# Patient Record
Sex: Female | Born: 1955 | Race: White | Hispanic: No | Marital: Married | Smoking: Former smoker
Health system: Southern US, Community
[De-identification: ages and names within clinical notes are randomized; demographics above are authoritative.]

## PROBLEM LIST (undated history)

## (undated) DIAGNOSIS — G20 Parkinson's disease: Secondary | ICD-10-CM

## (undated) DIAGNOSIS — F419 Anxiety disorder, unspecified: Secondary | ICD-10-CM

## (undated) DIAGNOSIS — E119 Type 2 diabetes mellitus without complications: Secondary | ICD-10-CM

## (undated) DIAGNOSIS — E079 Disorder of thyroid, unspecified: Secondary | ICD-10-CM

## (undated) DIAGNOSIS — G20A1 Parkinson's disease without dyskinesia, without mention of fluctuations: Secondary | ICD-10-CM

## (undated) DIAGNOSIS — E7800 Pure hypercholesterolemia, unspecified: Secondary | ICD-10-CM

## (undated) DIAGNOSIS — E1142 Type 2 diabetes mellitus with diabetic polyneuropathy: Secondary | ICD-10-CM

## (undated) DIAGNOSIS — F32A Depression, unspecified: Secondary | ICD-10-CM

## (undated) DIAGNOSIS — C801 Malignant (primary) neoplasm, unspecified: Secondary | ICD-10-CM

## (undated) DIAGNOSIS — F329 Major depressive disorder, single episode, unspecified: Secondary | ICD-10-CM

## (undated) HISTORY — PX: ROTATOR CUFF REPAIR: SHX139

## (undated) HISTORY — DX: Pure hypercholesterolemia, unspecified: E78.00

## (undated) HISTORY — DX: Parkinson's disease without dyskinesia, without mention of fluctuations: G20.A1

## (undated) HISTORY — PX: LUNG CANCER SURGERY: SHX702

## (undated) HISTORY — DX: Type 2 diabetes mellitus with diabetic polyneuropathy: E11.42

## (undated) HISTORY — DX: Parkinson's disease: G20

## (undated) HISTORY — PX: FINGER NAIL SURGERY: SHX717

## (undated) HISTORY — DX: Malignant (primary) neoplasm, unspecified: C80.1

---

## 1998-12-07 ENCOUNTER — Other Ambulatory Visit: Admission: RE | Admit: 1998-12-07 | Discharge: 1998-12-07 | Payer: Self-pay | Admitting: Obstetrics and Gynecology

## 1999-04-22 ENCOUNTER — Other Ambulatory Visit: Admission: RE | Admit: 1999-04-22 | Discharge: 1999-04-22 | Payer: Self-pay | Admitting: Obstetrics and Gynecology

## 1999-11-06 ENCOUNTER — Ambulatory Visit (HOSPITAL_COMMUNITY): Admission: RE | Admit: 1999-11-06 | Discharge: 1999-11-06 | Payer: Self-pay | Admitting: Gastroenterology

## 2000-04-17 ENCOUNTER — Other Ambulatory Visit: Admission: RE | Admit: 2000-04-17 | Discharge: 2000-04-17 | Payer: Self-pay | Admitting: Obstetrics and Gynecology

## 2001-05-12 ENCOUNTER — Other Ambulatory Visit: Admission: RE | Admit: 2001-05-12 | Discharge: 2001-05-12 | Payer: Self-pay | Admitting: Obstetrics and Gynecology

## 2001-11-26 ENCOUNTER — Ambulatory Visit (HOSPITAL_COMMUNITY): Admission: RE | Admit: 2001-11-26 | Discharge: 2001-11-26 | Payer: Self-pay | Admitting: Gastroenterology

## 2001-12-16 ENCOUNTER — Encounter: Payer: Self-pay | Admitting: Urology

## 2001-12-16 ENCOUNTER — Ambulatory Visit (HOSPITAL_BASED_OUTPATIENT_CLINIC_OR_DEPARTMENT_OTHER): Admission: RE | Admit: 2001-12-16 | Discharge: 2001-12-16 | Payer: Self-pay | Admitting: Urology

## 2002-08-15 ENCOUNTER — Other Ambulatory Visit: Admission: RE | Admit: 2002-08-15 | Discharge: 2002-08-15 | Payer: Self-pay | Admitting: Obstetrics and Gynecology

## 2002-09-20 ENCOUNTER — Encounter: Admission: RE | Admit: 2002-09-20 | Discharge: 2002-09-20 | Payer: Self-pay | Admitting: Urology

## 2002-09-20 ENCOUNTER — Encounter: Payer: Self-pay | Admitting: Urology

## 2002-09-22 ENCOUNTER — Encounter: Payer: Self-pay | Admitting: Emergency Medicine

## 2002-09-22 ENCOUNTER — Emergency Department (HOSPITAL_COMMUNITY): Admission: EM | Admit: 2002-09-22 | Discharge: 2002-09-22 | Payer: Self-pay | Admitting: Emergency Medicine

## 2003-09-01 ENCOUNTER — Other Ambulatory Visit: Admission: RE | Admit: 2003-09-01 | Discharge: 2003-09-01 | Payer: Self-pay | Admitting: Obstetrics and Gynecology

## 2003-09-06 ENCOUNTER — Encounter: Admission: RE | Admit: 2003-09-06 | Discharge: 2003-09-06 | Payer: Self-pay | Admitting: Gastroenterology

## 2004-05-30 ENCOUNTER — Ambulatory Visit (HOSPITAL_COMMUNITY): Admission: RE | Admit: 2004-05-30 | Discharge: 2004-05-30 | Payer: Self-pay | Admitting: Internal Medicine

## 2005-04-04 ENCOUNTER — Emergency Department (HOSPITAL_COMMUNITY): Admission: EM | Admit: 2005-04-04 | Discharge: 2005-04-04 | Payer: Self-pay | Admitting: Emergency Medicine

## 2009-02-16 ENCOUNTER — Encounter: Payer: Self-pay | Admitting: Pulmonary Disease

## 2009-03-09 ENCOUNTER — Ambulatory Visit: Payer: Self-pay | Admitting: Pulmonary Disease

## 2009-03-09 DIAGNOSIS — J42 Unspecified chronic bronchitis: Secondary | ICD-10-CM | POA: Insufficient documentation

## 2009-03-09 DIAGNOSIS — J438 Other emphysema: Secondary | ICD-10-CM | POA: Insufficient documentation

## 2009-03-09 DIAGNOSIS — R05 Cough: Secondary | ICD-10-CM

## 2009-03-09 DIAGNOSIS — E7800 Pure hypercholesterolemia, unspecified: Secondary | ICD-10-CM

## 2009-03-09 DIAGNOSIS — G4730 Sleep apnea, unspecified: Secondary | ICD-10-CM | POA: Insufficient documentation

## 2011-02-14 NOTE — Procedures (Signed)
[place]. [facility]  Patient:    NAVY, ROTHSCHILD Visit Number: [id number] MRN: [id number]          Service Type: END Location: ENDO Attending Physician:  Charna Elizabeth Dictated by:   Anselmo Rod, M.D. Proc. Date: 11/26/01 Admit Date:  11/26/2001   CC:         Laqueta Linden, M.D.   Procedure Report  DATE OF BIRTH:  04/18/1956.  PROCEDURE:  Colonoscopy.  ENDOSCOPIST:  Anselmo Rod, M.D.  INSTRUMENT USED:  Olympus video colonoscope.  INDICATION FOR PROCEDURE:  A 55 year old white female with a history of left lower quadrant pain and rectal bleeding.  The patient was also found to be guaiac-positive on physical exam in my office.  Rule out colonic polyps, masses, hemorrhoids, etc.  PREPROCEDURE PREPARATION:  Informed consent was procured from the patient. The patient was fasted for eight hours prior to the procedure and prepped with a bottle of magnesium citrate and two bottles of Fleets Phospho-Soda with a gallon of NuLytely the night prior to the procedure.  PREPROCEDURE PHYSICAL:  VITAL SIGNS:  The patient had stable vital signs.  NECK:  Supple.  CHEST:  Clear to auscultation.  S1, S2 regular.  ABDOMEN:  Soft with normal bowel sounds.  DESCRIPTION OF PROCEDURE:  The patient was placed in the left lateral decubitus position and sedated with 80 mg of Demerol and 8 mg of Versed intravenously.  Once the patient was adequately sedate and maintained on low-flow oxygen and continuous cardiac monitoring, the Olympus video colonoscope was advanced from the rectum to the cecum and terminal ileum without difficulty.  The entire colonic mucosa appeared healthy with a normal vascular pattern.  No masses, polyps, erosions, ulcerations were seen.  There was no evidence of diverticulosis.  The patient had small, nonbleeding internal hemorrhoids seen on retroflexion and tolerated the procedure well without complications.  There was some  residual stool in the colon, but multiple washes were done and visualization was adequate.  IMPRESSION:  Normal colonoscopy up to the terminal ileum except for small internal hemorrhoid.  RECOMMENDATIONS: 1. Zelnorm 6 mg b.i.d. has been advised to the patient with regard to her    ongoing constipation. 2. A high-fiber diet with liberal fluid intake has been advocated. 3. Outpatient follow-up is advised in the next three to four weeks. Dictated by:   Anselmo Rod, M.D. Attending Physician:  Charna Elizabeth DD:  11/26/01 TD:  11/26/01 Job: [id number]

## 2012-08-09 ENCOUNTER — Emergency Department (INDEPENDENT_AMBULATORY_CARE_PROVIDER_SITE_OTHER): Payer: 59

## 2012-08-09 ENCOUNTER — Encounter: Payer: Self-pay | Admitting: *Deleted

## 2012-08-09 ENCOUNTER — Emergency Department (INDEPENDENT_AMBULATORY_CARE_PROVIDER_SITE_OTHER)
Admission: EM | Admit: 2012-08-09 | Discharge: 2012-08-09 | Disposition: A | Discharge status: Home / Self Care | Payer: 59 | Source: Home / Self Care

## 2012-08-09 DIAGNOSIS — J449 Chronic obstructive pulmonary disease, unspecified: Secondary | ICD-10-CM

## 2012-08-09 DIAGNOSIS — R918 Other nonspecific abnormal finding of lung field: Secondary | ICD-10-CM

## 2012-08-09 DIAGNOSIS — J069 Acute upper respiratory infection, unspecified: Secondary | ICD-10-CM

## 2012-08-09 DIAGNOSIS — R05 Cough: Secondary | ICD-10-CM

## 2012-08-09 HISTORY — DX: Depression, unspecified: F32.A

## 2012-08-09 HISTORY — DX: Anxiety disorder, unspecified: F41.9

## 2012-08-09 HISTORY — DX: Disorder of thyroid, unspecified: E07.9

## 2012-08-09 HISTORY — DX: Major depressive disorder, single episode, unspecified: F32.9

## 2012-08-09 HISTORY — DX: Type 2 diabetes mellitus without complications: E11.9

## 2012-08-09 LAB — POCT INFLUENZA A/B: Influenza B, POC: NEGATIVE

## 2012-08-09 MED ORDER — PREDNISONE 50 MG PO TABS: ORAL_TABLET | ORAL | Status: DC

## 2012-08-09 MED ORDER — BENZONATATE 100 MG PO CAPS: 100.0000 mg | ORAL_CAPSULE | Freq: Three times a day (TID) | ORAL | Status: DC | PRN

## 2012-08-09 MED ORDER — DOXYCYCLINE HYCLATE 100 MG PO CAPS: 100.0000 mg | ORAL_CAPSULE | Freq: Two times a day (BID) | ORAL | Status: AC

## 2012-08-09 NOTE — ED Provider Notes (Signed)
History     CSN: [id number]  Arrival date & time 08/09/12  1148   First MD Initiated Contact with Patient 08/09/12 1150      Chief Complaint  Patient presents with  . Cough  . Otalgia    right    HPI  URI Symptoms Onset: 1 week  Description: post nasal drip, cough, ear pain  Modifying factors:  Prior hx/o PNA and lung ca. Also with informal dx of COPD in setting of former 40 pack year smoking history. Minimal lung complaints currently  Symptoms Nasal discharge: mild Fever: mild Sore throat: yes Cough: yes Wheezing: no Ear pain: no GI symptoms: no Sick contacts: yes  Red Flags  Stiff neck: no Dyspnea: no Rash: no Swallowing difficulty: no  Sinusitis Risk Factors Headache/face pain: no Double sickening: no tooth pain: no  Allergy Risk Factors Sneezing: no Itchy scratchy throat: no Seasonal symptoms: no  Flu Risk Factors Headache: no muscle aches: no severe fatigue: no   Past Medical History  Diagnosis Date  . Diabetes mellitus without complication   . Depression   . Anxiety   . Thyroid disease     Past Surgical History  Procedure Date  . Lung tumor removed     Family History  Problem Relation Age of Onset  . Heart failure Mother   . Non-Hodgkin's lymphoma Father   . Diabetes Sister   . Heart disease Sister     History  Substance Use Topics  . Smoking status: Former Games developer  . Smokeless tobacco: Not on file  . Alcohol Use: No    OB History    Grav Para Term Preterm Abortions TAB SAB Ect Mult Living                  Review of Systems  All other systems reviewed and are negative.    Allergies  Codeine and Hydromorphone hcl  Home Medications   Current Outpatient Rx  Name  Route  Sig  Dispense  Refill  . BUPROPION HCL ER (XL) 300 MG PO TB24   Oral   Take 300 mg by mouth daily.         Marland Kitchen CITALOPRAM HYDROBROMIDE 20 MG PO TABS   Oral   Take 20 mg by mouth daily.         Marland Kitchen CLONAZEPAM 2 MG PO TABS   Oral   Take 2 mg  by mouth 2 (two) times daily as needed.         Marland Kitchen LEVOTHYROXINE SODIUM 100 MCG PO TABS   Oral   Take 100 mcg by mouth daily.         Marland Kitchen METFORMIN HCL 500 MG PO TABS   Oral   Take 500 mg by mouth 2 (two) times daily with a meal.         . SIMVASTATIN 40 MG PO TABS   Oral   Take 40 mg by mouth every evening.           BP 138/82  Pulse 80  Temp 97.9 F (36.6 C) (Oral)  Resp 14  Ht 5\' 4"  (1.626 m)  Wt 197 lb (89.359 kg)  BMI 33.82 kg/m2  SpO2 97%  Physical Exam  Constitutional: She appears well-developed and well-nourished.  HENT:  Head: Normocephalic and atraumatic.  Right Ear: External ear normal.  Left Ear: External ear normal.       +nasal erythema, rhinorrhea bilaterally, + post oropharyngeal erythema    Eyes: Conjunctivae normal are  normal. Pupils are equal, round, and reactive to light.  Neck: Normal range of motion. Neck supple.  Cardiovascular: Normal rate, regular rhythm and normal heart sounds.   Pulmonary/Chest: Effort normal.       Faint rales in upper lobes  Faint wheezes    Abdominal: Soft.  Musculoskeletal: Normal range of motion.  Lymphadenopathy:    She has no cervical adenopathy.  Neurological: She is alert.  Skin: Skin is warm.    ED Course  Procedures (including critical care time)  Labs Reviewed - No data to display Dg Chest 2 View  08/09/2012  *RADIOLOGY REPORT*  Clinical Data: Cough for 1 week, history of pneumonia lung cancer  CHEST - 2 VIEW  Comparison: None  Findings: Surgical clips at mediastinum. Normal heart size, mediastinal contours and pulmonary vascularity. Atherosclerotic calcification aortic arch. Minimal atelectasis or scarring at left base. Lungs otherwise clear. No pleural effusion or pneumothorax. Bones unremarkable. Lateral view image quality degraded by quantum mottling artifact.  IMPRESSION: Minimal atelectasis versus scarring left base.   Original Report Authenticated By: Ulyses Southward, M.D.      1. URI (upper  respiratory infection)   2. COPD (chronic obstructive pulmonary disease)       MDM  Suspect URI induced mild COPD exacerbation.  Will treat with prednisone and doxy.  Discussed supportive care and infectious red flags. Lower threshold for reevaluation given medical history.  Plan for follow up in 2-3 days for reevaluation either here or with PCP.      The patient and/or caregiver has been counseled thoroughly with regard to treatment plan and/or medications prescribed including dosage, schedule, interactions, rationale for use, and possible side effects and they verbalize understanding. Diagnoses and expected course of recovery discussed and will return if not improved as expected or if the condition worsens. Patient and/or caregiver verbalized understanding.             Doree Albee, MD 08/09/12 [phone]

## 2012-08-09 NOTE — ED Notes (Signed)
Patient c/o dry cough right ear pain and sore throat x yesterday. Denies fever. No otc meds taken

## 2013-07-25 ENCOUNTER — Encounter: Payer: Self-pay | Admitting: Emergency Medicine

## 2013-07-25 ENCOUNTER — Emergency Department (INDEPENDENT_AMBULATORY_CARE_PROVIDER_SITE_OTHER): Payer: 59

## 2013-07-25 ENCOUNTER — Emergency Department (INDEPENDENT_AMBULATORY_CARE_PROVIDER_SITE_OTHER)
Admission: EM | Admit: 2013-07-25 | Discharge: 2013-07-25 | Disposition: A | Discharge status: Home / Self Care | Payer: 59 | Source: Home / Self Care | Attending: Emergency Medicine | Admitting: Emergency Medicine

## 2013-07-25 DIAGNOSIS — S139XXA Sprain of joints and ligaments of unspecified parts of neck, initial encounter: Secondary | ICD-10-CM

## 2013-07-25 DIAGNOSIS — M47812 Spondylosis without myelopathy or radiculopathy, cervical region: Secondary | ICD-10-CM

## 2013-07-25 DIAGNOSIS — S161XXA Strain of muscle, fascia and tendon at neck level, initial encounter: Secondary | ICD-10-CM

## 2013-07-25 MED ORDER — CYCLOBENZAPRINE HCL 10 MG PO TABS: ORAL_TABLET | ORAL | Status: DC

## 2013-07-25 MED ORDER — IBUPROFEN 600 MG PO TABS: 600.0000 mg | ORAL_TABLET | Freq: Four times a day (QID) | ORAL | Status: DC | PRN
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
## 2014-12-30 DIAGNOSIS — Z85118 Personal history of other malignant neoplasm of bronchus and lung: Secondary | ICD-10-CM | POA: Diagnosis not present

## 2014-12-30 DIAGNOSIS — G20 Parkinson's disease: Secondary | ICD-10-CM | POA: Diagnosis not present

## 2014-12-30 DIAGNOSIS — K219 Gastro-esophageal reflux disease without esophagitis: Secondary | ICD-10-CM | POA: Diagnosis not present

## 2014-12-30 DIAGNOSIS — Z7982 Long term (current) use of aspirin: Secondary | ICD-10-CM | POA: Diagnosis not present

## 2014-12-30 DIAGNOSIS — F329 Major depressive disorder, single episode, unspecified: Secondary | ICD-10-CM | POA: Diagnosis not present

## 2014-12-30 DIAGNOSIS — F419 Anxiety disorder, unspecified: Secondary | ICD-10-CM | POA: Diagnosis not present

## 2014-12-30 DIAGNOSIS — Z87891 Personal history of nicotine dependence: Secondary | ICD-10-CM | POA: Diagnosis not present

## 2014-12-30 DIAGNOSIS — Z79899 Other long term (current) drug therapy: Secondary | ICD-10-CM | POA: Diagnosis not present

## 2014-12-30 DIAGNOSIS — E780 Pure hypercholesterolemia: Secondary | ICD-10-CM | POA: Diagnosis not present

## 2014-12-30 DIAGNOSIS — E119 Type 2 diabetes mellitus without complications: Secondary | ICD-10-CM | POA: Diagnosis not present

## 2014-12-30 DIAGNOSIS — R079 Chest pain, unspecified: Secondary | ICD-10-CM | POA: Diagnosis present

## 2014-12-30 DIAGNOSIS — E785 Hyperlipidemia, unspecified: Secondary | ICD-10-CM | POA: Diagnosis not present

## 2014-12-30 DIAGNOSIS — E039 Hypothyroidism, unspecified: Secondary | ICD-10-CM | POA: Diagnosis not present

## 2014-12-30 DIAGNOSIS — Z886 Allergy status to analgesic agent status: Secondary | ICD-10-CM | POA: Diagnosis not present

## 2014-12-30 DIAGNOSIS — I10 Essential (primary) hypertension: Secondary | ICD-10-CM | POA: Diagnosis not present

## 2015-01-01 DIAGNOSIS — R079 Chest pain, unspecified: Secondary | ICD-10-CM

## 2015-01-02 ENCOUNTER — Encounter (HOSPITAL_COMMUNITY)
Admission: RE | Disposition: A | Discharge status: Home / Self Care | Payer: Self-pay | Source: Ambulatory Visit | Attending: Cardiology

## 2015-01-02 ENCOUNTER — Ambulatory Visit (HOSPITAL_COMMUNITY)
Admission: RE | Admit: 2015-01-02 | Discharge: 2015-01-02 | Disposition: A | Discharge status: Home / Self Care | Payer: 59 | Source: Ambulatory Visit | Attending: Cardiology | Admitting: Cardiology

## 2015-01-02 ENCOUNTER — Encounter (HOSPITAL_COMMUNITY): Payer: Self-pay | Admitting: Cardiology

## 2015-01-02 DIAGNOSIS — E039 Hypothyroidism, unspecified: Secondary | ICD-10-CM | POA: Insufficient documentation

## 2015-01-02 DIAGNOSIS — K219 Gastro-esophageal reflux disease without esophagitis: Secondary | ICD-10-CM | POA: Insufficient documentation

## 2015-01-02 DIAGNOSIS — E780 Pure hypercholesterolemia: Secondary | ICD-10-CM | POA: Insufficient documentation

## 2015-01-02 DIAGNOSIS — I10 Essential (primary) hypertension: Secondary | ICD-10-CM | POA: Insufficient documentation

## 2015-01-02 DIAGNOSIS — Z85118 Personal history of other malignant neoplasm of bronchus and lung: Secondary | ICD-10-CM | POA: Insufficient documentation

## 2015-01-02 DIAGNOSIS — Z7982 Long term (current) use of aspirin: Secondary | ICD-10-CM | POA: Insufficient documentation

## 2015-01-02 DIAGNOSIS — R079 Chest pain, unspecified: Secondary | ICD-10-CM | POA: Diagnosis not present

## 2015-01-02 DIAGNOSIS — G20 Parkinson's disease: Secondary | ICD-10-CM | POA: Insufficient documentation

## 2015-01-02 DIAGNOSIS — Z886 Allergy status to analgesic agent status: Secondary | ICD-10-CM | POA: Insufficient documentation

## 2015-01-02 DIAGNOSIS — F419 Anxiety disorder, unspecified: Secondary | ICD-10-CM | POA: Insufficient documentation

## 2015-01-02 DIAGNOSIS — Z79899 Other long term (current) drug therapy: Secondary | ICD-10-CM | POA: Insufficient documentation

## 2015-01-02 DIAGNOSIS — F329 Major depressive disorder, single episode, unspecified: Secondary | ICD-10-CM | POA: Insufficient documentation

## 2015-01-02 DIAGNOSIS — E785 Hyperlipidemia, unspecified: Secondary | ICD-10-CM | POA: Insufficient documentation

## 2015-01-02 DIAGNOSIS — E119 Type 2 diabetes mellitus without complications: Secondary | ICD-10-CM | POA: Insufficient documentation

## 2015-01-02 DIAGNOSIS — Z87891 Personal history of nicotine dependence: Secondary | ICD-10-CM | POA: Insufficient documentation

## 2015-01-02 HISTORY — PX: LEFT HEART CATHETERIZATION WITH CORONARY ANGIOGRAM: SHX5451

## 2015-01-02 LAB — GLUCOSE, CAPILLARY: GLUCOSE-CAPILLARY: 170 mg/dL — AB (ref 70–99)

## 2015-01-02 SURGERY — LEFT HEART CATHETERIZATION WITH CORONARY ANGIOGRAM
Anesthesia: LOCAL

## 2015-01-02 MED ORDER — FENTANYL CITRATE 0.05 MG/ML IJ SOLN
INTRAMUSCULAR | Status: AC
  Filled 2015-01-02: qty 2

## 2015-01-02 MED ORDER — DIPHENHYDRAMINE HCL 50 MG/ML IJ SOLN
INTRAMUSCULAR | Status: AC
  Filled 2015-01-02: qty 1

## 2015-01-02 MED ORDER — SODIUM CHLORIDE 0.9 % IV SOLN: 250.0000 mL | INTRAVENOUS | Status: DC | PRN

## 2015-01-02 MED ORDER — SODIUM CHLORIDE 0.9 % IV SOLN: 1.0000 mL/kg/h | INTRAVENOUS | Status: DC

## 2015-01-02 MED ORDER — ASPIRIN 81 MG PO CHEW
CHEWABLE_TABLET | ORAL | Status: AC
  Administered 2015-01-02: 81 mg
  Filled 2015-01-02: qty 1

## 2015-01-02 MED ORDER — NITROGLYCERIN 1 MG/10 ML FOR IR/CATH LAB
INTRA_ARTERIAL | Status: AC
  Filled 2015-01-02: qty 10

## 2015-01-02 MED ORDER — HEPARIN (PORCINE) IN NACL 2-0.9 UNIT/ML-% IJ SOLN
INTRAMUSCULAR | Status: AC
  Filled 2015-01-02: qty 1000

## 2015-01-02 MED ORDER — SODIUM CHLORIDE 0.9 % IJ SOLN
3.0000 mL | INTRAMUSCULAR | Status: DC | PRN
  Administered 2015-01-02: 3 mL via INTRAVENOUS
  Filled 2015-01-02: qty 3

## 2015-01-02 MED ORDER — LIDOCAINE HCL (PF) 1 % IJ SOLN
INTRAMUSCULAR | Status: AC
  Filled 2015-01-02: qty 30

## 2015-01-02 MED ORDER — ASPIRIN 81 MG PO CHEW: 81.0000 mg | CHEWABLE_TABLET | ORAL | Status: DC

## 2015-01-02 MED ORDER — EMPAGLIFLOZIN-METFORMIN HCL 5-1000 MG PO TABS: 1.0000 | ORAL_TABLET | Freq: Every day | ORAL | Status: DC

## 2015-01-02 MED ORDER — SODIUM CHLORIDE 0.9 % IJ SOLN: 3.0000 mL | Freq: Two times a day (BID) | INTRAMUSCULAR | Status: DC

## 2015-01-02 MED ORDER — VERAPAMIL HCL 2.5 MG/ML IV SOLN
INTRAVENOUS | Status: AC
  Filled 2015-01-02: qty 2

## 2015-01-02 MED ORDER — MIDAZOLAM HCL 2 MG/2ML IJ SOLN
INTRAMUSCULAR | Status: AC
  Filled 2015-01-02: qty 2

## 2015-01-02 MED ORDER — SODIUM CHLORIDE 0.9 % IV SOLN
Freq: Once | INTRAVENOUS | Status: AC
  Administered 2015-01-02: 09:00:00 via INTRAVENOUS

## 2015-01-02 MED ORDER — HEPARIN SODIUM (PORCINE) 1000 UNIT/ML IJ SOLN
INTRAMUSCULAR | Status: AC
  Filled 2015-01-02: qty 1

## 2015-01-02 MED ORDER — SODIUM CHLORIDE 0.9 % IV SOLN: INTRAVENOUS | Status: DC

## 2015-01-02 NOTE — Interval H&P Note (Signed)
History and Physical Interval Note:  01/02/2015 11:06 AM  Kelli Rojas  has presented today for surgery, with the diagnosis of cp, abnormal stress test  The various methods of treatment have been discussed with the patient and family. After consideration of risks, benefits and other options for treatment, the patient has consented to  Procedure(s): LEFT HEART CATHETERIZATION WITH CORONARY ANGIOGRAM (N/A) and possible PCI  as a surgical intervention .  The patient's history has been reviewed, patient examined, no change in status, stable for surgery.  I have reviewed the patient's chart and labs.  Questions were answered to the patient's satisfaction.   Ischemic Symptoms? CCS II (Slight limitation of ordinary activity) Anti-ischemic Medical Therapy? Minimal Therapy (1 class of medications) Non-invasive Test Results? Intermediate-risk stress test findings: cardiac mortality 1-3%/year Prior CABG? No Previous CABG   Patient Information:   1-2V CAD, no prox LAD  U (5)  Indication: 16; Score: 5   Patient Information:   CTO of 1 vessel, no other CAD  U (4)  Indication: 26; Score: 4   Patient Information:   1V CAD with prox LAD  U (6)  Indication: 32; Score: 6   Patient Information:   2V-CAD with prox LAD  A (7)  Indication: 38; Score: 7   Patient Information:   3V-CAD without LMCA  A (7)  Indication: 44; Score: 7   Patient Information:   3V-CAD without LMCA With Abnormal LV systolic function  A (9)  Indication: 48; Score: 9   Patient Information:   LMCA-CAD  A (9)  Indication: 49; Score: 9   Patient Information:   2V-CAD with prox LAD PCI  A (7)  Indication: 62; Score: 7   Patient Information:   2V-CAD with prox LAD CABG  A (8)  Indication: 62; Score: 8   Patient Information:   3V-CAD without LMCA With Low CAD burden(i.e., 3 focal stenoses, low SYNTAX score) PCI  A (7)  Indication: 63; Score: 7   Patient Information:   3V-CAD without  LMCA With Low CAD burden(i.e., 3 focal stenoses, low SYNTAX score) CABG  A (9)  Indication: 63; Score: 9   Patient Information:   3V-CAD without LMCA E06c - Intermediate-high CAD burden (i.e., multiple diffuse lesions, presence of CTO, or high SYNTAX score) PCI  U (4)  Indication: 64; Score: 4   Patient Information:   3V-CAD without LMCA E06c - Intermediate-high CAD burden (i.e., multiple diffuse lesions, presence of CTO, or high SYNTAX score) CABG  A (9)  Indication: 64; Score: 9   Patient Information:   LMCA-CAD With Isolated LMCA stenosis  PCI  U (6)  Indication: 65; Score: 6   Patient Information:   LMCA-CAD With Isolated LMCA stenosis  CABG  A (9)  Indication: 65; Score: 9   Patient Information:   LMCA-CAD Additional CAD, low CAD burden (i.e., 1- to 2-vessel additional involvement, low SYNTAX score) PCI  U (5)  Indication: 66; Score: 5   Patient Information:   LMCA-CAD Additional CAD, low CAD burden (i.e., 1- to 2-vessel additional involvement, low SYNTAX score) CABG  A (9)  Indication: 66; Score: 9   Patient Information:   LMCA-CAD Additional CAD, intermediate-high CAD burden (i.e., 3-vessel involvement, presence of CTO, or high SYNTAX score) PCI  I (3)  Indication: 67; Score: 3   Patient Information:   LMCA-CAD Additional CAD, intermediate-high CAD burden (i.e., 3-vessel involvement, presence of CTO, or high SYNTAX score) CABG  A (9)  Indication: 67; Score: 9  Laverda Page

## 2015-01-02 NOTE — H&P (Addendum)
Kelli Rojas is an 59 y.o. female.   Chief Complaint: Chest pain HPI: Kelli Rojas is 59 years old white female. She has felt mild pain in the precordial region off and on for past couple of months. She describes it as aching type of pain. It is not related to exertion and there is no radiation to the arms, neck or back of the chest. The pain usually subsides in couple of minutes. No associated dyspnea, diaphoresis or nausea.  Patient has also noticed shortness of breath for past 6 months on minor exertion, even walking in the house. It is associated with sweating. There is no dyspnea at rest and no orthopnea or PND.  No complaints of palpitation, sudden heart racing or irregular heart rate. No history of dizziness, near syncope or syncope. No history of swelling on the legs. No history of MI or any cardiac problems in the past.  Patient has diabetes mellitus type 2, hypercholesterolemia. No history of hypertension. She has very strong family history of premature CAD. Her mother died from CAD at age 7 years, one sister died from CAD at 77 years and 2 sisters died from CAD at age 60 years. She had smoked for many years in the past but quit smoking in 2011 when she was diagnosed to have lung cancer. Patient had surgery for cancer of right lower lobe of lung, she has done well since then and there is no history of recurrence.  Patient has history of parkinsonism, hypothyroidism and GERD. No history of TIA or CVA. No new symptoms today, no change in medications.  Past Medical History  Diagnosis Date  . Diabetes mellitus without complication   . Depression   . Anxiety   . Thyroid disease     Past Surgical History  Procedure Laterality Date  . Lung tumor removed      Family History  Problem Relation Age of Onset  . Heart failure Mother   . Non-Hodgkin's lymphoma Father   . Diabetes Sister   . Heart disease Sister    Social History:  reports that she has quit smoking. She does  not have any smokeless tobacco history on file. She reports that she does not drink alcohol or use illicit drugs.  Allergies:  Allergies  Allergen Reactions  . Codeine   . Hydromorphone Hcl     Medications Prior to Admission  Medication Sig Dispense Refill  . ARIPiprazole (ABILIFY) 10 MG tablet Take 10 mg by mouth daily.    Marland Kitchen aspirin 81 MG tablet Take 81 mg by mouth daily.    . carbidopa-levodopa (SINEMET IR) 25-100 MG per tablet Take 1 tablet by mouth 3 (three) times daily.    . Ciclesonide 37 MCG/ACT AERS Place 1 spray into the nose daily as needed (Allergies).    . Cyanocobalamin (B-12) 2000 MCG TABS Take 1 tablet by mouth daily.    . diclofenac (VOLTAREN) 75 MG EC tablet Take 75 mg by mouth 2 (two) times daily as needed.  0  . Empagliflozin-Metformin HCl 01-999 MG TABS Take 1 tablet by mouth daily.    . ergocalciferol (VITAMIN D2) 50000 UNITS capsule Take 50,000 Units by mouth once a week. Saturday    . gabapentin (NEURONTIN) 100 MG capsule Take 100 mg by mouth 3 (three) times daily as needed (pain in feet).    Marland Kitchen ketoconazole (NIZORAL) 2 % shampoo Apply 1 application topically once a week.  0  . levothyroxine (SYNTHROID, LEVOTHROID) 112 MCG tablet Take 112 mcg  by mouth daily before breakfast.    . LORazepam (ATIVAN) 2 MG tablet Take 2 mg by mouth at bedtime.    . metoprolol succinate (TOPROL-XL) 25 MG 24 hr tablet Take 25 mg by mouth daily.  0  . NITROSTAT 0.4 MG SL tablet Place 0.4 mg under the tongue every 5 (five) minutes x 3 doses as needed.  0  . omeprazole (PRILOSEC) 40 MG capsule Take 40 mg by mouth daily.    . rasagiline (AZILECT) 1 MG TABS tablet Take 1 mg by mouth daily.    Marland Kitchen rOPINIRole (REQUIP) 0.5 MG tablet Take 0.5 mg by mouth at bedtime as needed.    . sertraline (ZOLOFT) 100 MG tablet Take 100 mg by mouth daily.    . simvastatin (ZOCOR) 40 MG tablet Take 40 mg by mouth every evening.    . benzonatate (TESSALON PERLES) 100 MG capsule Take 1 capsule (100 mg total) by  mouth 3 (three) times daily as needed for cough. (Patient not taking: Reported on 01/02/2015) 20 capsule 0  . cyclobenzaprine (FLEXERIL) 10 MG tablet 1/2 OR 1  tablet every 8 hours as needed for muscle relaxant. Take a whole tablet at bedtime.-- Caution: May cause drowsiness. (Patient not taking: Reported on 01/02/2015) 21 tablet 0  . ibuprofen (ADVIL,MOTRIN) 600 MG tablet Take 1 tablet (600 mg total) by mouth every 6 (six) hours as needed for pain. (Patient not taking: Reported on 01/02/2015) 30 tablet 0  . predniSONE (DELTASONE) 50 MG tablet 1 tab daily x 7 days (Patient not taking: Reported on 12/29/2014) 7 tablet 0      Review of Systems - GENERAL- Feels tired, No fever, chills. Has obesity, No recent weight change. CARDIO VASCULAR- Has chest pain, Has shortness of breath, No orthopnea or PND. No palpitation, dizziness, fainting. No hypertension. Has h/o high cholesterol. No swelling on legs. No claudication in legs, No cramps. No h/o DVT PULMONARY- No cough, phlegm, wheezing, not feeling congested in chest. GASTROINTESTINAL- No abdominal pain, nausea, vomiting or diarrhea. No dark tarry stools.Normal appetite. Has heartburn. No jaundice. ENDOCRINE- Has hypothyroidism, No feeling of excessive heat or cold, No polydipsia or polyuria. Has Diabetes. NEUROLOGICAL- No focal motor or sensory symptoms, Good coordination. No seizures. Has h/o Parkinsonism. MUSCULOSKELETAL- No generalized myalgias or muscle weakness. No joint swelling SKIN- No skin rash, No pruritus HEMATOLOGY- No anemia, petechiae, excessive bruising, epistaxis, GI bleed or any abnormal bleeding.  Blood pressure 146/74, pulse 70, temperature 97.5 F (36.4 C), temperature source Oral, resp. rate 18, height 5\' 4"  (1.626 m), weight 97.07 kg (214 lb), SpO2 95 %. GENERAL APPEARANCE- Alert, Oriented. Well built, Moderately Obese. HEENT- Unremarkable, fundi were not examined. Pt. wears glasses. NECK- No JVD. Carotid pulses are 2+, No  bruits audible. No thyromegaly. No lymphadenopathy. HEART- Auscultation- Normal S1, S2. No gallops or murmurs audible. CHEST- Normal shape. Normal percussion. Auscultation- Normal breath sounds, No crepitations. No wheezing. ABDOMEN- Slightly obese. Palpation- Soft, Nontender. No hepatosplenomegaly. No masses felt. Auscultation- Normal bowel sounds. No bruits audible. EXTREMITIES- No Clubbing or Cyanosis. No edema on legs or feet. PERIPHERAL PULSES- Both femoral pulses- 2+, No bruits audible. Both dorsalis pedis pulses- 2+, Both posterior tibial pulses- 2+  Results for orders placed or performed during the hospital encounter of 01/02/15 (from the past 48 hour(s))  Glucose, capillary     Status: Abnormal   Collection Time: 01/02/15  8:46 AM  Result Value Ref Range   Glucose-Capillary 170 (H) 70 - 99 mg/dL   Out  patient studies  1. Pharmacologic Nuclear stress test. 12/01/2014 1. The resting electrocardiogram demonstrated normal sinus rhythm, normal resting conduction, Poor R progression, no resting arrhythmias and nonspecific T inversion changes in inferior and lateral leads. Stress EKG is nondiagnostic for ischemia as it is a Pharmacologic stress test using Lexiscan infusion. Stress symptoms included dyspnea, dizziness. 2. The T.I.D. is 1.49 and suggests transient ischemic dilatation. Perfusion imaging study demonstrates possible septal ischemia, small sized. The sum difference on polar plot images was only 2. The left ventricular systolic function Calculated by QGS was 59%, there was no wall motion abnormality. Overall this represents an intermediate risk, clinical correlation is recommended.  2. Echo- 12/21/2014 1. Left ventricle cavity is normal in size. Normal global wall motion. Doppler evidence of grade I (impaired) diastolic dysfunction. Calculated EF 66%. 2. Mild mitral regurgitation. 3. Trace tricuspid regurgitation  EKG: 11/29/2014: Normal sinus rhythm at a rate of 77 beats a  minute, normal axis, incomplete right bundle branch block. Diffuse nonspecific anterior and lateral ST segment depression with T-wave inversion.   Assessment/Plan Chest pain (R07.9) Shortness of breath on exertion (R06.02) Hypercholesteremia (E78.0) Moderate Obesity (E66.9) Diabetes mellitus type 2 controlled  Recommendation: Patient has exertional chest discomfort, she has class II symptoms of angina pectoris associated with worsening dyspnea on exertion and a intermediate risk stress test. I have again personally discussed her symptoms with the patient and discussed risks, benefits and alternatives to coronary angiography, patient wants to proceed.  Laverda Page, MD 01/02/2015, 10:54 AM [facility]. [place] Pager: [phone] Office: [phone] If no answer: Cell:  [phone]

## 2015-01-02 NOTE — Discharge Instructions (Signed)
Radial Site Care °Refer to this sheet in the next few weeks. These instructions provide you with information on caring for yourself after your procedure. Your caregiver may also give you more specific instructions. Your treatment has been planned according to current medical practices, but problems sometimes occur. Call your caregiver if you have any problems or questions after your procedure. °HOME CARE INSTRUCTIONS °· You may shower the day after the procedure. Remove the bandage (dressing) and gently wash the site with plain soap and water. Gently pat the site dry. °· Do not apply powder or lotion to the site. °· Do not submerge the affected site in water for 3 to 5 days. °· Inspect the site at least twice daily. °· Do not flex or bend the affected arm for 24 hours. °· No lifting over 5 pounds (2.3 kg) for 5 days after your procedure. °· Do not drive home if you are discharged the same day of the procedure. Have someone else drive you. °· You may drive 24 hours after the procedure unless otherwise instructed by your caregiver. °· Do not operate machinery or power tools for 24 hours. °· A responsible adult should be with you for the first 24 hours after you arrive home. °What to expect: °· Any bruising will usually fade within 1 to 2 weeks. °· Blood that collects in the tissue (hematoma) may be painful to the touch. It should usually decrease in size and tenderness within 1 to 2 weeks. °SEEK IMMEDIATE MEDICAL CARE IF: °· You have unusual pain at the radial site. °· You have redness, warmth, swelling, or pain at the radial site. °· You have drainage (other than a small amount of blood on the dressing). °· You have chills. °· You have a fever or persistent symptoms for more than 72 hours. °· You have a fever and your symptoms suddenly get worse. °· Your arm becomes pale, cool, tingly, or numb. °· You have heavy bleeding from the site. Hold pressure on the site. °Document Released: 10/18/2010 Document Revised:  12/08/2011 Document Reviewed: 10/18/2010 °ExitCare® Patient Information ©2015 [employer]. This information is not intended to replace advice given to you by your health care provider. Make sure you discuss any questions you have with your health care provider. ° °

## 2015-01-02 NOTE — CV Procedure (Signed)
Procedure performed:  Left heart catheterization including hemodynamic monitoring of the left ventricle, LV gram, selective right and left coronary arteriography.  Indication patient is a 59 year-old Caucasian female with history of hypertension,  hyperlipidemia,  Diabetes Mellitus   who presents with chest pain and dyspnea on exertion. Patient has  had non invasive testing which was abnormal, with evidence of post stress LV dilatation. Intermediate this can.  Hence is brought to the cardiac catheterization lab to evaluate the  coronary anatomy for definitive diagnosis of CAD.  Hemodynamic data:  Left ventricular pressure was 126/8 with LVEDP of 16 mm mercury. Aortic pressure was 129/70 with a mean of 96 mm mercury. There was no pressure gradient across the aortic valve  Left ventricle: Performed in the RAO projection revealed LVEF of 55-60 %. There was no significant MR. No wall motion abnormality. Hand contrast injection.  Right coronary artery: The vessel is smooth, normal, gives origin to large PL and PDA branches. Dominant.  Left main coronary artery is large and normal.It is very small and trifurcates immediately.  Circumflex coronary artery: A large vessel, It is smooth and normal.  it continues as a large OM 1, 2 in the distal segment.  LAD:  LAD  Is a large caliber vessel, ostium has 10% stenosis, rest of the vessel is smooth with midsegment showing again 5-10% luminal irregularity. 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 The major epicardial vessels did not have any significant disease.   Technique: Under sterile precautions using a 6 French right radial  arterial access, a 6 French sheath was introduced into the right radial artery. A 5 [place] Tig 4 catheter was advanced into the ascending aorta selective  right coronary artery and left coronary artery was cannulated and angiography was performed in multiple views. The catheter was pulled back Out of the body over  exchange length J-wire. A Versacore wire was utilized to initially reduce the catheter and access the ascending aorta. Same Catheter was used to perform LV gram which was performed in RAO projection. Catheter exchanged out of the body over J-Wire. NO immediate complications noted. Patient tolerated the procedure well.   Rec: Medical therapy with aggressive risk factor reduction.  her chest pain could be related to septal perforator stenosis and microvascular angina pectoris.  Disposition: Will be discharged home today with outpatient follow up.

## 2015-01-17 DIAGNOSIS — E139 Other specified diabetes mellitus without complications: Secondary | ICD-10-CM | POA: Insufficient documentation

## 2015-01-17 DIAGNOSIS — G20 Parkinson's disease: Secondary | ICD-10-CM | POA: Insufficient documentation

## 2015-06-13 ENCOUNTER — Encounter: Payer: Self-pay | Admitting: Neurology

## 2015-06-27 ENCOUNTER — Encounter: Payer: Self-pay | Admitting: Neurology

## 2015-06-27 ENCOUNTER — Ambulatory Visit (INDEPENDENT_AMBULATORY_CARE_PROVIDER_SITE_OTHER): Payer: 59 | Admitting: Neurology

## 2015-06-27 VITALS — BP 122/86 | HR 73 | Ht 64.0 in | Wt 198.0 lb

## 2015-06-27 DIAGNOSIS — G629 Polyneuropathy, unspecified: Secondary | ICD-10-CM

## 2015-06-27 DIAGNOSIS — G2119 Other drug induced secondary parkinsonism: Secondary | ICD-10-CM

## 2015-06-27 DIAGNOSIS — F331 Major depressive disorder, recurrent, moderate: Secondary | ICD-10-CM | POA: Diagnosis not present

## 2015-06-27 DIAGNOSIS — E1342 Other specified diabetes mellitus with diabetic polyneuropathy: Secondary | ICD-10-CM

## 2015-06-27 DIAGNOSIS — E1142 Type 2 diabetes mellitus with diabetic polyneuropathy: Secondary | ICD-10-CM

## 2015-06-27 NOTE — Progress Notes (Signed)
Kelli Rojas was seen today in the movement disorders clinic for neurologic consultation at the request of [name], MD.  The consultation is for the evaluation of Parkinsons disease.  She has previously been seen at [facility] neurology but those records are not available to me.  Pt states that her first sx was jaw tremor and that was about a year ago.  She then noted L hand tremor.  She was seen at [facility] neurology in 2015 and states that she had an EMG and was told that she had PD.  She was placed on carbidopa/levodopa 25/100 tid at the diagnosis in 2015 (6am, 5pm, and bedtime) and she thinks that it helps with jaw tremor and some with the L hand tremor.  She was also started on azilect at the same time.  She was on requip before the diagnosis (0.5 mg) for RLS but she is off of that now.  She has been on abilify for about 3-4 years and the dose was increased about 2-3 months ago from '10mg'$  to 15 mg (Dr. Noemi Chapel).  Specific Symptoms:  Tremor: Yes.   Voice: no change in strength, but gotten deeper per pt Sleep: trouble getting and staying asleep  Vivid Dreams:  Yes.    Acting out dreams:  No. Wet Pillows: No. Postural symptoms:  Yes.    Falls?  No. Bradykinesia symptoms: slow movements Loss of smell:  No. Loss of taste:  No. Urinary Incontinence:  Yes.   (stress) Difficulty Swallowing:  Yes.   (some with pills) Handwriting, micrographia: No. (messier than in the past) Trouble with ADL's:  No. (but does need to sit to put on pants)  Trouble buttoning clothing: No. Depression:  Yes.   (but feels well controlled with meds) Memory changes:  Yes.   Hallucinations:  No.  visual distortions: No. N/V:  No. Lightheaded:  No.  Syncope: No. Diplopia:  No. Dyskinesia:  No.  Neuroimaging has previously been performed.  The films are not available for my review today.  The MRI report is available.  It was done on 12/14/2013 and was reported to show minimal T2  hyperintensities.  PREVIOUS MEDICATIONS: Sinemet  ALLERGIES:   Allergies  Allergen Reactions  . Codeine   . Crestor  [Rosuvastatin]     myalgia  . Hydromorphone Hcl   . Trazodone And Nefazodone     HA, sleepy hangover    CURRENT MEDICATIONS:  Outpatient Encounter Prescriptions as of 06/27/2015  Medication Sig  . ARIPiprazole (ABILIFY) 10 MG tablet Take 15 mg by mouth daily.   Marland Kitchen aspirin 81 MG tablet Take 81 mg by mouth daily.  Marland Kitchen atorvastatin (LIPITOR) 40 MG tablet Take 40 mg by mouth daily.  . Calcium Citrate-Vitamin D (CALCIUM + D PO) Take by mouth daily.  . carbidopa-levodopa (SINEMET IR) 25-100 MG per tablet Take 1 tablet by mouth 3 (three) times daily.  . Cyanocobalamin (B-12) 2000 MCG TABS Take 1 tablet by mouth daily.  . Empagliflozin-Metformin HCl 01-999 MG TABS Take 1 tablet by mouth daily.  . ergocalciferol (VITAMIN D2) 50000 UNITS capsule Take 50,000 Units by mouth once a week. Saturday  . gabapentin (NEURONTIN) 100 MG capsule Take 100 mg by mouth daily.   Marland Kitchen ketoconazole (NIZORAL) 2 % shampoo Apply 1 application topically once a week.  . levothyroxine (SYNTHROID, LEVOTHROID) 112 MCG tablet Take 112 mcg by mouth daily before breakfast.  . LORazepam (ATIVAN) 2 MG tablet Take 2 mg by mouth at bedtime.  Marland Kitchen  metoprolol succinate (TOPROL XL) 25 MG 24 hr tablet Take 25 mg by mouth daily.  Marland Kitchen omeprazole (PRILOSEC) 40 MG capsule Take 40 mg by mouth daily.  . rasagiline (AZILECT) 1 MG TABS tablet Take 1 mg by mouth daily.  . sertraline (ZOLOFT) 100 MG tablet Take 100 mg by mouth daily.  . Suvorexant (BELSOMRA) 20 MG TABS Take by mouth at bedtime.  Marland Kitchen NITROSTAT 0.4 MG SL tablet Place 0.4 mg under the tongue every 5 (five) minutes x 3 doses as needed.  . [DISCONTINUED] Ciclesonide 37 MCG/ACT AERS Place 1 spray into the nose daily as needed (Allergies).  . [DISCONTINUED] diclofenac (VOLTAREN) 75 MG EC tablet Take 75 mg by mouth 2 (two) times daily as needed.  . [DISCONTINUED] metoprolol  succinate (TOPROL-XL) 25 MG 24 hr tablet Take 25 mg by mouth daily.  . [DISCONTINUED] rOPINIRole (REQUIP) 0.5 MG tablet Take 0.5 mg by mouth at bedtime as needed.  . [DISCONTINUED] simvastatin (ZOCOR) 40 MG tablet Take 40 mg by mouth every evening.   No facility-administered encounter medications on file as of 06/27/2015.    PAST MEDICAL HISTORY:   Past Medical History  Diagnosis Date  . Diabetes mellitus without complication   . Depression   . Anxiety   . Thyroid disease   . Parkinson's disease   . Diabetic peripheral neuropathy     PAST SURGICAL HISTORY:   Past Surgical History  Procedure Laterality Date  . Lung cancer surgery    . Left heart catheterization with coronary angiogram N/A 01/02/2015    Procedure: LEFT HEART CATHETERIZATION WITH CORONARY ANGIOGRAM;  Surgeon: Adrian Prows, MD;  Location: [facility] CATH LAB;  Service: Cardiovascular;  Laterality: N/A;    SOCIAL HISTORY:   Social History   Social History  . Marital Status: Married    Spouse Name: N/A  . Number of Children: N/A  . Years of Education: N/A   Occupational History  . Not on file.   Social History Main Topics  . Smoking status: Former Smoker    Quit date: 06/26/2010  . Smokeless tobacco: Not on file  . Alcohol Use: No  . Drug Use: No  . Sexual Activity: Not on file   Other Topics Concern  . Not on file   Social History Narrative    FAMILY HISTORY:   Family Status  Relation Status Death Age  . Mother Deceased     heart failure  . Father Deceased     nonhodgkin's lymphoma  . Sister Deceased     x3 - DM, heart disease    ROS:  A complete 10 system review of systems was obtained and was unremarkable apart from what is mentioned above.  PHYSICAL EXAMINATION:    VITALS:   Filed Vitals:   06/27/15 0850  BP: 122/86  Pulse: 73  Height: '5\' 4"'$  (1.626 m)  Weight: 198 lb (89.812 kg)    GEN:  The patient appears stated age and is in NAD. HEENT:  Normocephalic, atraumatic.  The mucous membranes  are moist. The superficial temporal arteries are without ropiness or tenderness. CV:  RRR Lungs:  CTAB Neck/HEME:  There are no carotid bruits bilaterally.  Neurological examination:  Orientation: The patient is alert and oriented x3. Fund of knowledge is appropriate.  Recent and remote memory are intact.  Attention and concentration are normal.    Able to name objects and repeat phrases. Cranial nerves: There is good facial symmetry. Pupils are equal round and reactive to light bilaterally. Fundoscopic  exam reveals clear margins bilaterally. Extraocular muscles are intact. The visual fields are full to confrontational testing. The speech is fluent and clear. Soft palate rises symmetrically and there is no tongue deviation. Hearing is intact to conversational tone. Sensation: Sensation is intact to light and pinprick throughout (facial, trunk, extremities). Vibration is decreased at the bilateral big toe. There is no extinction with double simultaneous stimulation. There is no sensory dermatomal level identified. Motor: Strength is 5/5 in the bilateral upper and lower extremities.   Shoulder shrug is equal and symmetric.  There is no pronator drift. Deep tendon reflexes: Deep tendon reflexes are 2/4 at the bilateral biceps, triceps, brachioradialis, patella and 1/4 at the bilateral achilles. Plantar responses are downgoing bilaterally.  Movement examination: Tone: There is minimal increased tone in the left upper extremity.  Tone elsewhere was normal.   Abnormal movements: There is a left upper extremity resting tremor. Coordination:  There is decremation with RAM's, seen most significantly on the left with hand opening and closing and finger taps, but it was also evident on the right with alternation of supination/pronation of the forearm. Gait and Station: The patient has no significant difficulty arising out of a deep-seated chair without the use of the hands. The patient's stride length is  normal, with slightly decreased arm swing bilaterally.    ASSESSMENT/PLAN:  1.  Parkinsonism  -I had a long discussion with the patient today regarding a possible diagnosis of Parkinson's disease.  She is on Abilify, which can cause parkinsonism via D2 receptor blockade.  I explained to her that the diagnosis of idiopathic Parkinson's disease cannot be made when one is on Abilify.  She apparently was on Abilify at the time of diagnosis of "idiopathic" Parkinson's disease.  In addition, even if she had Parkinson's disease, I do not recommend that she remains on Abilify.  She is on medications that both block dopamine via D2 receptor blockade and also is on a dopamine agonist and levodopa, which is essentially dopamine itself.  I told her that it is not my decision to change her psychiatric medications as she certainly may need them, but it would be my recommendation that she talk to Dr. Dorethea Clan about whether or not she has other options.  I told her if she is able to get off anti-psychotic medication, then it could take up to 6 months to know whether or not she has Parkinson's disease.  If she needs an atypical anti-psychotic medication, then Seroquel would be preferable, although this agent can also produce parkinsonism.  Clozaril does not produce parkinsonism, although blood monitoring is tedious with this medication.  I will leave these decisions between the patient and her psychiatrist.  -After this long discussion, the patient asked me about whether or not she should stay on the levodopa.  I told her that this is purely her decision.  If she comes off of his notes, it is my suspicion that tremor will increase, primarily because she just recently went up on the Abilify.  However, hopefully she will be able to change her psychiatric medication and perhaps her symptoms will slowly lessen.  Personally, if she is able to alter her psychiatric medications, then I would discontinue the levodopa and see how she  does.  I would also discontinue the Azilect.  -Regardless of what she and her psychiatrist decide, I told her that I would like to see her back in 6 months, but I told her that I would be happy to see her  back before that time if she decides that she needs me or if symptoms worsen.  She was agreeable.  Much greater than 50% of this 80 minute visit was spent in counseling with the patient 2.  Mild diabetic peripheral neuropathy  -safety was discussed

## 2015-06-27 NOTE — Progress Notes (Signed)
Note routed to Dr Forde Dandy and Dr Dorethea Clan.

## 2015-07-09 ENCOUNTER — Telehealth: Payer: Self-pay | Admitting: Neurology

## 2015-07-09 NOTE — Telephone Encounter (Signed)
Pt called in regards to a letter that Dr Tat was going to write to Shon Hale in regards to her coming off her Abilify/Dawn CB# [phone]

## 2015-07-10 NOTE — Telephone Encounter (Signed)
Please advise 

## 2015-07-10 NOTE — Telephone Encounter (Signed)
Patient made aware we sent her office note to [facility].

## 2015-07-10 NOTE — Telephone Encounter (Signed)
I think that my plan was to send a copy of my office note to [employer] and I think that we did that?

## 2015-07-19 ENCOUNTER — Telehealth: Payer: Self-pay | Admitting: Neurology

## 2015-07-19 NOTE — Telephone Encounter (Signed)
Reviewed [facility] records from when pt first placed on levodopa.  In their records, it did not say that pt was on abilify at the time levodopa was started (11/2013); however, in the 03/2015 note, abilify still was not listed in her med list in their records so wonder if their list wasn't updated.

## 2015-07-26 DIAGNOSIS — Z1211 Encounter for screening for malignant neoplasm of colon: Secondary | ICD-10-CM | POA: Insufficient documentation

## 2015-10-18 ENCOUNTER — Other Ambulatory Visit (HOSPITAL_COMMUNITY): Payer: Self-pay | Admitting: Psychiatry

## 2015-11-15 ENCOUNTER — Encounter: Payer: Self-pay | Admitting: Rehabilitative and Restorative Service Providers"

## 2015-11-15 ENCOUNTER — Ambulatory Visit (INDEPENDENT_AMBULATORY_CARE_PROVIDER_SITE_OTHER): Payer: 59 | Admitting: Rehabilitative and Restorative Service Providers"

## 2015-11-15 DIAGNOSIS — M7541 Impingement syndrome of right shoulder: Secondary | ICD-10-CM

## 2015-11-15 DIAGNOSIS — M539 Dorsopathy, unspecified: Secondary | ICD-10-CM

## 2015-11-15 DIAGNOSIS — R531 Weakness: Secondary | ICD-10-CM | POA: Diagnosis not present

## 2015-11-15 DIAGNOSIS — Z7409 Other reduced mobility: Secondary | ICD-10-CM

## 2015-11-15 DIAGNOSIS — M2560 Stiffness of unspecified joint, not elsewhere classified: Secondary | ICD-10-CM

## 2015-11-15 DIAGNOSIS — M623 Immobility syndrome (paraplegic): Secondary | ICD-10-CM

## 2015-11-15 DIAGNOSIS — R6889 Other general symptoms and signs: Secondary | ICD-10-CM

## 2015-11-15 DIAGNOSIS — M7542 Impingement syndrome of left shoulder: Secondary | ICD-10-CM

## 2015-11-15 NOTE — Patient Instructions (Addendum)
Self massage with ~4 inch rubber ball   Axial Extension (Chin Tuck)    Pull chin in and lengthen back of neck. Hold _10-15___ seconds while counting out loud. Repeat __5-0__ times. Do _several ___ sessions per day.    Shoulder Blade Squeeze   Can use swim noodle to help with posture  Rotate shoulders back, then squeeze shoulder blades down and back Hold 10 sec  Repeat _10___ times. Do __several __ sessions per day.  TENS UNIT: This is helpful for muscle pain and spasm.   Search and Purchase a TENS 7000 2nd edition at www.tenspros.com. It should be less than $30.     TENS unit instructions: Do not shower or bathe with the unit on Turn the unit off before removing electrodes or batteries If the electrodes lose stickiness add a drop of water to the electrodes after they are disconnected from the unit and place on plastic sheet. If you continued to have difficulty, call the TENS unit company to purchase more electrodes. Do not apply lotion on the skin area prior to use. Make sure the skin is clean and dry as this will help prolong the life of the electrodes. After use, always check skin for unusual red areas, rash or other skin difficulties. If there are any skin problems, does not apply electrodes to the same area. Never remove the electrodes from the unit by pulling the wires. Do not use the TENS unit or electrodes other than as directed. Do not change electrode placement without consultating your therapist or physician. Keep 2 fingers with between each electrode.

## 2015-11-15 NOTE — Therapy (Addendum)
[place], [place], [address] Phone: [phone]   Fax:  [phone]  Physical Therapy Evaluation  Patient Details  Name: Kelli Rojas MRN: [id number] Date of Birth: [birth year] Referring Provider: Dr. Suella Broad  Encounter Date: 11/15/2015      PT End of Session - 11/15/15 1017    Visit Number 1   Number of Visits 12   Date for PT Re-Evaluation 12/27/15   PT Start Time 1018   PT Stop Time 1119   PT Time Calculation (min) 61 min   Activity Tolerance Patient tolerated treatment well      Past Medical History  Diagnosis Date  . Diabetes mellitus without complication ([place])   . Depression   . Anxiety   . Thyroid disease   . Parkinson's disease ([place])   . Diabetic peripheral neuropathy [facility])     Past Surgical History  Procedure Laterality Date  . Lung cancer surgery    . Left heart catheterization with coronary angiogram N/A 01/02/2015    Procedure: LEFT HEART CATHETERIZATION WITH CORONARY ANGIOGRAM;  Surgeon: Adrian Prows, MD;  Location: [facility] CATH LAB;  Service: Cardiovascular;  Laterality: N/A;    There were no vitals filed for this visit.  Visit Diagnosis:  Cervical dysfunction - Plan: PT plan of care cert/re-cert  Impingement syndrome of both shoulders - Plan: PT plan of care cert/re-cert  Stiffness due to immobility - Plan: PT plan of care cert/re-cert  Weakness - Plan: PT plan of care cert/re-cert  Decreased strength, endurance, and mobility - Plan: PT plan of care cert/re-cert      Subjective Assessment - 11/15/15 1020    Subjective Kelli Rojas reports that she has C5/6 discs that have arthritis and arthritis in both shoulders. She has pain in the neck and in both shoulders radiating into both arms. Symptoms have been present for ~6 months with no known cause of symptoms or known injury. she had cortisone injections in both shoulders last week with no significant change in symptoms.     Pertinent History denies any medical musculoskeletal problems. She has AODM; hypothyroidism; HTN   How long can you sit comfortably? 20-30 min   How long can you stand comfortably? no problem   How long can you walk comfortably? no problem    Diagnostic tests xrays   Patient Stated Goals stop the pain    Currently in Pain? Yes   Pain Score 3    Pain Location Neck   Pain Orientation Right   Pain Descriptors / Indicators Nagging   Pain Type Chronic pain   Pain Radiating Towards radiating into the shoulders    Pain Onset More than a month ago   Pain Frequency Constant   Aggravating Factors  moving head and neck from Lt to Rt; raising arms; driving   Pain Relieving Factors Advil helps for about an hour            [facility] PT Assessment - 11/15/15 0001    Assessment   Medical Diagnosis DDD cervical spine; bilat shd dysfunction/impingement   Referring Provider Dr. Suella Broad   Onset Date/Surgical Date 04/30/15   Hand Dominance Right   Next MD Visit 12/19/15   Prior Therapy none   Precautions   Precautions None   Balance Screen   Has the patient fallen in the past 6 months No   Has the patient had a decrease in activity level because of a fear of falling?  No  Is the patient reluctant to leave their home because of a fear of falling?  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   Leisure household chores; sedentary at home - TV - recliner    Observation/Other Assessments   Focus on Therapeutic Outcomes (FOTO)  45% limitation    Sensation   Additional Comments WFL's per pt report    Posture/Postural Control   Posture Comments head forward shoulders rounded and elevated; head of the humerus anterior in orientation; scapulae abducted and  rotated along the thoracic wall    AROM   Right/Left Shoulder --  pain w/ all shd motions; bilat lat flex/Rt rot cervical ROM   Right Shoulder Flexion 112 Degrees   Right Shoulder ABduction 97 Degrees   Right Shoulder Internal Rotation 27 Degrees   Right Shoulder External Rotation 54 Degrees   Left Shoulder Flexion 133 Degrees   Left Shoulder ABduction 101 Degrees   Left Shoulder Internal Rotation 29 Degrees   Left Shoulder External Rotation 61 Degrees   Cervical Flexion 54   Cervical Extension 32   Cervical - Right Side Bend 33   Cervical - Left Side Bend 24   Cervical - Right Rotation 48   Cervical - Left Rotation 39   Strength   Right Shoulder Flexion 4+/5   Right Shoulder Internal Rotation 4+/5   Right Shoulder External Rotation 4+/5   Left Shoulder Flexion --  5-/5   Left Shoulder ABduction 4+/5   Left Shoulder Internal Rotation 4/5   Left Shoulder External Rotation 4/5   Palpation   Palpation comment tightness through anterior chest/pecs; upper traps; ant/lat/post cervical musculature bilaterally   Special Tests    Special Tests --  pain Rt with enpty can                    [facility] Adult PT Treatment/Exercise - 11/15/15 0001    Therapeutic Activites    Therapeutic Activities --  myofacial ball release work    Neuro Re-ed    Neuro Re-ed Details  postural education and correction    Neck Exercises: Standing   Neck Retraction 10 reps;10 secs   Other Standing Exercises scap squeeze 10 sec x 10 reps with swim noodle    Moist Heat Therapy   Number Minutes Moist Heat 15 Minutes   Moist Heat Location Cervical;Shoulder  bilat shds   Electrical Stimulation   Electrical Stimulation Location bilat cervical/bilat shds    Electrical Stimulation Action IFC   Electrical Stimulation Parameters to tolerance    Electrical Stimulation Goals Pain;Tone     Plan; spinal education; pec stretch; posterior shoulder girdle strengthening; manual work; modalities as  indicated           PT Education - 11/15/15 1244    Education provided Yes   Education Details myofacial ball release; HEP   Person(s) Educated Patient   Methods Explanation;Demonstration;Tactile cues;Verbal cues;Handout   Comprehension Verbalized understanding;Returned demonstration;Verbal cues required;Tactile cues required             PT Long Term Goals - 11/15/15 1249    PT LONG TERM GOAL #1   Title Improve posture and alignment with pt to demonstrate upright posture and head and neck over body  and shoulder blades down and back 12/27/15   Time 6   Period Days   Status New   PT LONG TERM GOAL #2   Title Improve cervical and shoulder AROM to [facility] 12/27/15   Time 6   Period Weeks   Status New   PT LONG TERM GOAL #3   Title Increase bilat UE strength to 4+/5 to 5/5 12/27/15   Time 6   Period Weeks   Status New   PT LONG TERM GOAL #4   Title Patient I in HEP 12/27/15   Time 6   Period Weeks   Status New   PT LONG TERM GOAL #5   Title Improve FOTO to </= 34% limitation 12/27/15   Time 6   Period Weeks   Status New                Problem List Patient Active Problem List   Diagnosis Date Noted  . Chest pain with high risk for cardiac etiology 01/01/2015  . HYPERCHOLESTEROLEMIA 03/09/2009  . BRONCHITIS, RECURRENT 03/09/2009  . EMPHYSEMA 03/09/2009  . SLEEP APNEA 03/09/2009  . COUGH 03/09/2009    Esco Joslyn [name] PT, MPH  11/15/2015, 1:00 PM  [facility] [place], [place], [address] Phone: [phone]   Fax:  [phone]  Name: Kelli Rojas MRN: [id number] Date of Birth: 1955/10/21

## 2015-11-19 ENCOUNTER — Ambulatory Visit (INDEPENDENT_AMBULATORY_CARE_PROVIDER_SITE_OTHER): Payer: 59 | Admitting: Rehabilitative and Restorative Service Providers"

## 2015-11-19 ENCOUNTER — Encounter: Payer: Self-pay | Admitting: Rehabilitative and Restorative Service Providers"

## 2015-11-19 DIAGNOSIS — M7541 Impingement syndrome of right shoulder: Secondary | ICD-10-CM | POA: Diagnosis not present

## 2015-11-19 DIAGNOSIS — M2560 Stiffness of unspecified joint, not elsewhere classified: Secondary | ICD-10-CM

## 2015-11-19 DIAGNOSIS — M539 Dorsopathy, unspecified: Secondary | ICD-10-CM | POA: Diagnosis not present

## 2015-11-19 DIAGNOSIS — M623 Immobility syndrome (paraplegic): Secondary | ICD-10-CM

## 2015-11-19 DIAGNOSIS — M7542 Impingement syndrome of left shoulder: Secondary | ICD-10-CM

## 2015-11-19 DIAGNOSIS — R531 Weakness: Secondary | ICD-10-CM

## 2015-11-19 DIAGNOSIS — R6889 Other general symptoms and signs: Secondary | ICD-10-CM

## 2015-11-19 DIAGNOSIS — Z7409 Other reduced mobility: Secondary | ICD-10-CM

## 2015-11-19 NOTE — Patient Instructions (Addendum)
Scapula Adduction With Pectoralis Stretch: Low - Standing   Shoulders at 45 hands even with shoulders, keeping weight through legs, shift weight forward until you feel pull or stretch through the front of your chest. Hold _30__ seconds. Do _3__ times, _2-4__ times per day.   Scapula Adduction With Pectoralis Stretch: Mid-Range - Standing   Shoulders at 90 elbows even with shoulders, keeping weight through legs, shift weight forward until you feel pull or strength through the front of your chest. Hold __30_ seconds. Do _3__ times, __2-4_ times per day.   Scapula Adduction With Pectoralis Stretch: High - Standing   Shoulders at 120 hands up high on the doorway, keeping weight on feet, shift weight forward until you feel pull or stretch through the front of your chest. Hold _30__ seconds. Do _3__ times, _2-3__ times per day.  Biceps, Standing With Partner (this is the position for your arm but you will hook your hand on the table and step forward to feel the stretch in the front of your arm)     Place arms behind back with hands together, palms facing down. Have partner raise hands. Hold _30__ seconds. Repeat _2-3__ times per session. Do _2-3__ sessions per day.

## 2015-11-19 NOTE — Therapy (Signed)
[place], [place], [address] Phone: [phone]   Fax:  [phone]  Physical Therapy Treatment  Patient Details  Name: Kelli Rojas MRN: [id number] Date of Birth: 06-15-1956 Referring Provider: Dr. Suella Broad  Encounter Date: 11/19/2015      PT End of Session - 11/19/15 1602    Visit Number 2   Number of Visits 12   Date for PT Re-Evaluation 12/27/15   PT Start Time 1603   PT Stop Time 1655   PT Time Calculation (min) 52 min   Activity Tolerance Patient tolerated treatment well      Past Medical History  Diagnosis Date  . Diabetes mellitus without complication ([place])   . Depression   . Anxiety   . Thyroid disease   . Parkinson's disease ([place])   . Diabetic peripheral neuropathy [facility])     Past Surgical History  Procedure Laterality Date  . Lung cancer surgery    . Left heart catheterization with coronary angiogram N/A 01/02/2015    Procedure: LEFT HEART CATHETERIZATION WITH CORONARY ANGIOGRAM;  Surgeon: Adrian Prows, MD;  Location: [facility] CATH LAB;  Service: Cardiovascular;  Laterality: N/A;    There were no vitals filed for this visit.  Visit Diagnosis:  Cervical dysfunction  Impingement syndrome of both shoulders  Stiffness due to immobility  Weakness  Decreased strength, endurance, and mobility      Subjective Assessment - 11/19/15 1605    Subjective Kelli Rojas reports thst she is sore - had a masssage Saturday and is sore through the upper traps. She has done her exercises and ordered a noodle.   Currently in Pain? No/denies                         [facility] Adult PT Treatment/Exercise - 11/19/15 0001    Neuro Re-ed    Neuro Re-ed Details  postural education and correction    Neck Exercises: Standing   Neck Retraction 10 reps;10 secs   Other Standing Exercises scap squeeze 10 sec x 10 reps with swim noodle    Shoulder Exercises: Supine   Other Supine Exercises  snow angel stretch arms at ~70 dseg 2-3 min    Shoulder Exercises: ROM/Strengthening   UBE (Upper Arm Bike) L2 x 4 min alt fwd/back    Shoulder Exercises: Stretch   Other Shoulder Stretches 3 way doorway stretch 30 sec x 3 each position    Other Shoulder Stretches biceps stretch bilat 30 sec x 2   Cryotherapy   Number Minutes Cryotherapy 15 Minutes   Cryotherapy Location Cervical;Shoulder  bilat shds    Type of Cryotherapy Ice pack   Electrical Stimulation   Electrical Stimulation Location bilat cervical/bilat shds ant    Electrical Stimulation Action IFC   Electrical Stimulation Parameters to tolerance    Electrical Stimulation Goals Pain;Tone   Manual Therapy   Manual therapy comments Pt supine    Soft tissue mobilization pecs; clavicular area;    Myofascial Release chest/pecs   Manual Traction manual cervical traction                 PT Education - 11/19/15 1629    Education provided Yes   Education Details HEP    Person(s) Educated Patient   Methods Explanation;Demonstration;Tactile cues;Verbal cues;Handout   Comprehension Verbalized understanding;Returned demonstration;Verbal cues required;Tactile cues required             PT Long Term Goals -  11/19/15 1649    PT LONG TERM GOAL #1   Title Improve posture and alignment with pt to demonstrate upright posture and head and neck over body and shoulder blades down and back 12/27/15   Time 6   Period Weeks   Status On-going   PT LONG TERM GOAL #2   Title Improve cervical and shoulder AROM to [facility] 12/27/15   Time 6   Period Weeks   Status On-going   PT LONG TERM GOAL #3   Title Increase bilat UE strength to 4+/5 to 5/5 12/27/15   Time 6   Period Weeks   Status On-going   PT LONG TERM GOAL #4   Title Patient I in HEP 12/27/15   Time 6   Period Weeks   Status On-going   PT LONG TERM GOAL #5   Title Improve FOTO to </= 34% limitation 12/27/15   Time 6   Period Weeks   Status On-going                Plan - 11/19/15 1648    Clinical Impression Statement Patient reports that she feels she is making some progress. She has been working on her posture and exercises at home. Noted some imprpovement in posture and alignment. Tolerated new exercises without difficulty.    Pt will benefit from skilled therapeutic intervention in order to improve on the following deficits Postural dysfunction;Improper body mechanics;Decreased range of motion;Decreased mobility;Decreased endurance;Decreased activity tolerance;Pain   Rehab Potential Good   PT Frequency 2x / week   PT Duration 6 weeks   PT Treatment/Interventions Patient/family education;ADLs/Self Care Home Management;Therapeutic exercise;Therapeutic activities;Neuromuscular re-education;Manual techniques;Dry needling;Cryotherapy;Electrical Stimulation;Iontophoresis '4mg'$ /ml Dexamethasone;Moist Heat;Ultrasound   PT Next Visit Plan spine education; pec stretching; posterior shoulder girdle strengthening; manual work; modalities as indicated.    PT Home Exercise Plan myofacial ball release work; HEP    Consulted and Agree with Plan of Care Patient        Problem List Patient Active Problem List   Diagnosis Date Noted  . Chest pain with high risk for cardiac etiology 01/01/2015  . HYPERCHOLESTEROLEMIA 03/09/2009  . BRONCHITIS, RECURRENT 03/09/2009  . EMPHYSEMA 03/09/2009  . SLEEP APNEA 03/09/2009  . COUGH 03/09/2009    Kelli Rojas [name] PT, MPH  11/19/2015, 4:51 PM  [facility] [place], [place], [address] Phone: [phone]   Fax:  [phone]  Name: Kelli Rojas MRN: [id number] Date of Birth: Jun 13, 1956

## 2015-11-21 ENCOUNTER — Ambulatory Visit (INDEPENDENT_AMBULATORY_CARE_PROVIDER_SITE_OTHER): Payer: 59 | Admitting: Physical Therapy

## 2015-11-21 DIAGNOSIS — Z7409 Other reduced mobility: Secondary | ICD-10-CM

## 2015-11-21 DIAGNOSIS — M7541 Impingement syndrome of right shoulder: Secondary | ICD-10-CM | POA: Diagnosis not present

## 2015-11-21 DIAGNOSIS — M623 Immobility syndrome (paraplegic): Secondary | ICD-10-CM | POA: Diagnosis not present

## 2015-11-21 DIAGNOSIS — M539 Dorsopathy, unspecified: Secondary | ICD-10-CM

## 2015-11-21 DIAGNOSIS — M2560 Stiffness of unspecified joint, not elsewhere classified: Secondary | ICD-10-CM

## 2015-11-21 DIAGNOSIS — R531 Weakness: Secondary | ICD-10-CM | POA: Diagnosis not present

## 2015-11-21 DIAGNOSIS — M7542 Impingement syndrome of left shoulder: Secondary | ICD-10-CM

## 2015-11-21 NOTE — Patient Instructions (Addendum)
Over Head Pull: Narrow Grip        On back, knees bent, feet flat, band across thighs, elbows straight but relaxed. Pull hands apart (start). Keeping elbows straight, bring arms up and over head, hands toward floor. Keep pull steady on band. Hold momentarily. Return slowly, keeping pull steady, back to start. Repeat _10__ times. Band color __yellow ____   Side Pull: Double Arm   On back, knees bent, feet flat. Arms perpendicular to body, shoulder level, elbows straight but relaxed. Pull arms out to sides, elbows straight. Resistance band comes across collarbones, hands toward floor. Hold momentarily. Slowly return to starting position. Repeat _10__ times, 2 sets. Band color _yellow____   Sash   On back, knees bent, feet flat, left hand on left hip, right hand above left. Pull right arm DIAGONALLY (hip to shoulder) across chest. Thumb is up (statue of liberty/hitch hike) Bring right arm along head toward floor. Hold momentarily. Slowly return to starting position. Repeat _10__ times. Do with left arm. Band color ___yellow___   Shoulder Rotation: Double Arm   On back, knees bent, feet flat, elbows tucked at sides, bent 90, hands palms up. Pull hands apart and down toward floor, keeping elbows near sides. Hold momentarily. Slowly return to starting position. Repeat _10__ times, 2 sets. Band color __yellow ____    [facility] [place], [place] [address]  [phone] (office) [phone] (fax)

## 2015-11-21 NOTE — Therapy (Signed)
[place], [place], [address] Phone: [phone]   Fax:  [phone]  Physical Therapy Treatment  Patient Details  Name: Kelli Rojas MRN: [id number] Date of Birth: 06-07-1956 Referring Provider: Dr. Suella Broad   Encounter Date: 11/21/2015      PT End of Session - 11/21/15 0735    Visit Number 3   Number of Visits 12   Date for PT Re-Evaluation 12/27/15   PT Start Time 0734   PT Stop Time 0806   PT Time Calculation (min) 32 min   Activity Tolerance Patient tolerated treatment well;No increased pain      Past Medical History  Diagnosis Date  . Diabetes mellitus without complication ([place])   . Depression   . Anxiety   . Thyroid disease   . Parkinson's disease ([place])   . Diabetic peripheral neuropathy [facility])     Past Surgical History  Procedure Laterality Date  . Lung cancer surgery    . Left heart catheterization with coronary angiogram N/A 01/02/2015    Procedure: LEFT HEART CATHETERIZATION WITH CORONARY ANGIOGRAM;  Surgeon: Adrian Prows, MD;  Location[facility] C[facility] LAB;  Service: Cardiovascular;  Laterality: N/A;    There were no vitals filed for this visit.  Visit Diagnosis:  Cervical dysfunction  Impingement syndrome of both shoulders  Stiffness due to immobility  Weakness  Decreased strength, endurance, and mobility        [facility] Adult PT Treatment/Exercise - 11/21/15 0001    Shoulder Exercises: Supine   Horizontal ABduction Strengthening;Both;10 reps;Theraband  2 sets   Theraband Level (Shoulder Horizontal ABduction) Level 1 (Yellow)   External Rotation Strengthening;Both;10 reps;Theraband  2 sets   Theraband Level (Shoulder External Rotation) Level 1 (Yellow)   Flexion Both;15 reps  overhead pull per handout   Theraband Level (Shoulder Flexion) Level 1 (Yellow)   Other Supine Exercises snow angel stretch arms at ~70 dseg 2-3 min, then active snow angels to tolerance x 8  reps    Other Supine Exercises scap squeeze with thoracic lift x 3 sec hold x 10 reps.  D2 pattern with LUE painful, so Sash exercise held for now.    Shoulder Exercises: Stretch   Other Shoulder Stretches 2 position doorway stretch, switched to unilateral for increased comfort x 30 sec x 2 reps each arm, each position.    Other Shoulder Stretches shoulder ext stretch with towel behind back    Modalities   Modalities --  pt declined as she was pain free throughout treatment   Manual Therapy   Manual therapy comments Pt supine    Soft tissue mobilization pecs; clavicular area;    Myofascial Release Lt pec near clavical                 PT Education - 11/21/15 0841    Education provided Yes   Education Details HEP - added supine scap exercises (holding on sash for now)   Person(s) Educated Patient   Methods Explanation;Handout   Comprehension Verbalized understanding;Returned demonstration             PT Long Term Goals - 11/19/15 1649    PT LONG TERM GOAL #1   Title Improve posture and alignment with pt to demonstrate upright posture and head and neck over body and shoulder blades down and back 12/27/15   Time 6   Period Weeks   Status On-going   PT LONG TERM GOAL #2   Title Improve cervical  and shoulder AROM to [facility] 12/27/15   Time 6   Period Weeks   Status On-going   PT LONG TERM GOAL #3   Title Increase bilat UE strength to 4+/5 to 5/5 12/27/15   Time 6   Period Weeks   Status On-going   PT LONG TERM GOAL #4   Title Patient I in HEP 12/27/15   Time 6   Period Weeks   Status On-going   PT LONG TERM GOAL #5   Title Improve FOTO to </= 34% limitation 12/27/15   Time 6   Period Weeks   Status On-going               Plan - 11/21/15 0756    Clinical Impression Statement Pt had positive response to last treatment and has been painfree for 2 days.  Pt tolerated new supine exercises well, without increase in pain.  Progressing towards goals.   Pt will  benefit from skilled therapeutic intervention in order to improve on the following deficits Postural dysfunction;Improper body mechanics;Decreased range of motion;Decreased mobility;Decreased endurance;Decreased activity tolerance;Pain   Rehab Potential Good   PT Frequency 2x / week   PT Duration 6 weeks   PT Treatment/Interventions Patient/family education;ADLs/Self Care Home Management;Therapeutic exercise;Therapeutic activities;Neuromuscular re-education;Manual techniques;Dry needling;Cryotherapy;Electrical Stimulation;Iontophoresis '4mg'$ /ml Dexamethasone;Moist Heat;Ultrasound   PT Next Visit Plan spine education; pec stretching; posterior shoulder girdle strengthening; manual work; modalities as indicated.    Consulted and Agree with Plan of Care Patient        Problem List Patient Active Problem List   Diagnosis Date Noted  . Chest pain with high risk for cardiac etiology 01/01/2015  . HYPERCHOLESTEROLEMIA 03/09/2009  . BRONCHITIS, RECURRENT 03/09/2009  . EMPHYSEMA 03/09/2009  . SLEEP APNEA 03/09/2009  . COUGH 03/09/2009   Kerin Perna, PTA 11/21/2015 12:11 PM   [place], [place], [address] Phone: [phone]   Fax:  [phone]  Name: Kelli Rojas MRN: [id number] Date of Birth: November 11, 1955

## 2015-11-26 ENCOUNTER — Encounter: Payer: Self-pay | Admitting: Rehabilitative and Restorative Service Providers"

## 2015-11-26 ENCOUNTER — Ambulatory Visit (INDEPENDENT_AMBULATORY_CARE_PROVIDER_SITE_OTHER): Payer: 59 | Admitting: Rehabilitative and Restorative Service Providers"

## 2015-11-26 DIAGNOSIS — M2560 Stiffness of unspecified joint, not elsewhere classified: Secondary | ICD-10-CM

## 2015-11-26 DIAGNOSIS — Z7409 Other reduced mobility: Secondary | ICD-10-CM

## 2015-11-26 DIAGNOSIS — M539 Dorsopathy, unspecified: Secondary | ICD-10-CM | POA: Diagnosis not present

## 2015-11-26 DIAGNOSIS — M623 Immobility syndrome (paraplegic): Secondary | ICD-10-CM

## 2015-11-26 DIAGNOSIS — M7541 Impingement syndrome of right shoulder: Secondary | ICD-10-CM | POA: Diagnosis not present

## 2015-11-26 DIAGNOSIS — M7542 Impingement syndrome of left shoulder: Secondary | ICD-10-CM

## 2015-11-26 DIAGNOSIS — R531 Weakness: Secondary | ICD-10-CM

## 2015-11-26 NOTE — Therapy (Signed)
[place], [place], [address] Phone: [phone]   Fax:  [phone]  Physical Therapy Treatment  Patient Details  Name: Kelli Rojas MRN: [id number] Date of Birth: 18-Aug-1956 Referring Provider: Dr. Suella Broad  Encounter Date: 11/26/2015      PT End of Session - 11/26/15 1613    Visit Number 4   Number of Visits 12   Date for PT Re-Evaluation 12/27/15   PT Start Time 1601   PT Stop Time 8280   PT Time Calculation (min) 53 min   Activity Tolerance Patient tolerated treatment well;No increased pain      Past Medical History  Diagnosis Date  . Diabetes mellitus without complication ([place])   . Depression   . Anxiety   . Thyroid disease   . Parkinson's disease ([place])   . Diabetic peripheral neuropathy [facility])     Past Surgical History  Procedure Laterality Date  . Lung cancer surgery    . Left heart catheterization with coronary angiogram N/A 01/02/2015    Procedure: LEFT HEART CATHETERIZATION WITH CORONARY ANGIOGRAM;  Surgeon: Adrian Prows, MD;  Location: [facility] CATH LAB;  Service: Cardiovascular;  Laterality: N/A;    There were no vitals filed for this visit.  Visit Diagnosis:  Cervical dysfunction  Impingement syndrome of both shoulders  Stiffness due to immobility  Weakness  Decreased strength, endurance, and mobility      Subjective Assessment - 11/26/15 1618    Subjective Kit reports that she has had increased aching and soreness through both shoulders all weekend "they hurt"She does not know of anything she did differently at work or home. Did add the "rubber band exercises' at last visit. She did not have trouble with them when she did them and has continued to try to do them but has experienced significant increase in aching and pain in both shoudlers Lt > Rt    Currently in Pain? Yes   Pain Score 6    Pain Location Shoulder   Pain Orientation Right;Left   Pain Descriptors /  Indicators Aching   Pain Type Chronic pain   Pain Onset More than a month ago   Pain Frequency Constant            [facility] PT Assessment - 11/26/15 0001    Assessment   Medical Diagnosis DDD cervical spine; bilat shd dysfunction/impingement   Referring Provider Dr. Suella Broad   Onset Date/Surgical Date 04/30/15   Hand Dominance Right   Next MD Visit 12/19/15   Prior Therapy none   Palpation   Palpation comment increase in tightness through anterior chest/pecs; upper traps; ant/lat/post cervical musculature bilaterally                     [facility] Adult PT Treatment/Exercise - 11/26/15 0001    Shoulder Exercises: ROM/Strengthening   UBE (Upper Arm Bike) L2 x 4 min alt fwd/back    Shoulder Exercises: Stretch   Other Shoulder Stretches 2 position doorway stretch, trial of bilat and unilateral positions - both created discomfort  x 20-30 sec x 2 reps each arm, each position.    Moist Heat Therapy   Number Minutes Moist Heat 15 Minutes   Moist Heat Location Cervical;Shoulder  bilat shds    Electrical Stimulation   Electrical Stimulation Location bilat shds - pecs and biceps proximally    Electrical Stimulation Action IFC   Electrical Stimulation Parameters to tolerance    Electrical Stimulation Goals  Pain;Tone   Manual Therapy   Manual therapy comments Pt supine    Soft tissue mobilization pecs; clavicular area;    Myofascial Release bilat pecs/anterior shoulders    Manual Traction manual cervical traction                 PT Education - 11/26/15 1647    Education provided Yes   Education Details hold theraband exercises to assess pain response/change   Person(s) Educated Patient   Methods Explanation   Comprehension Verbalized understanding             PT Long Term Goals - 11/26/15 1650    PT LONG TERM GOAL #1   Title Improve posture and alignment with pt to demonstrate upright posture and head and neck over body and shoulder blades down and  back 12/27/15   Time 6   Period Weeks   Status On-going   PT LONG TERM GOAL #2   Title Improve cervical and shoulder AROM to [facility] 12/27/15   Time 6   Period Weeks   Status On-going   PT LONG TERM GOAL #3   Title Increase bilat UE strength to 4+/5 to 5/5 12/27/15   Time 6   Period Weeks   Status On-going   PT LONG TERM GOAL #4   Title Patient I in HEP 12/27/15   Time 6   Period Weeks   Status On-going   PT LONG TERM GOAL #5   Title Improve FOTO to </= 34% limitation 12/27/15   Time 6   Period Weeks   Status On-going               Plan - 11/26/15 1648    Clinical Impression Statement Patient was progressing well untl the past several days when she has experienced significant increase in pain 0/10 to 6/10. We will hold theraband exercises and continue with gentle stretching and use of TENS unit as well sa heat and ice to hopefully calm down the current symptoms.   Pt will benefit from skilled therapeutic intervention in order to improve on the following deficits Postural dysfunction;Improper body mechanics;Decreased range of motion;Decreased mobility;Decreased endurance;Decreased activity tolerance;Pain   Rehab Potential Good   PT Frequency 2x / week   PT Duration 6 weeks   PT Treatment/Interventions Patient/family education;ADLs/Self Care Home Management;Therapeutic exercise;Therapeutic activities;Neuromuscular re-education;Manual techniques;Dry needling;Cryotherapy;Electrical Stimulation;Iontophoresis 6m/ml Dexamethasone;Moist Heat;Ultrasound   PT Next Visit Plan spine education; pec stretching; posterior shoulder girdle strengthening; manual work; modalities as indicated. Assess response to change in HEP    PT Home Exercise Plan myofacial ball release work; HEP hold theraband exercises    Consulted and Agree with Plan of Care Patient        Problem List Patient Active Problem List   Diagnosis Date Noted  . Chest pain with high risk for cardiac etiology 01/01/2015  .  HYPERCHOLESTEROLEMIA 03/09/2009  . BRONCHITIS, RECURRENT 03/09/2009  . EMPHYSEMA 03/09/2009  . SLEEP APNEA 03/09/2009  . COUGH 03/09/2009    Alexei Doswell PNilda SimmerPT, MPH  11/26/2015, 4:53 PM  [facility] [address]Phone: [phone]  Fax:  [phone] Name: CLAUNI ASENCIOMRN: [id number] of Birth: 111/30/1957

## 2015-11-29 ENCOUNTER — Ambulatory Visit (INDEPENDENT_AMBULATORY_CARE_PROVIDER_SITE_OTHER): Payer: 59 | Admitting: Physical Therapy

## 2015-11-29 ENCOUNTER — Encounter: Payer: Self-pay | Admitting: Physical Therapy

## 2015-11-29 DIAGNOSIS — R531 Weakness: Secondary | ICD-10-CM | POA: Diagnosis not present

## 2015-11-29 DIAGNOSIS — M623 Immobility syndrome (paraplegic): Secondary | ICD-10-CM | POA: Diagnosis not present

## 2015-11-29 DIAGNOSIS — M539 Dorsopathy, unspecified: Secondary | ICD-10-CM | POA: Diagnosis not present

## 2015-11-29 DIAGNOSIS — M7542 Impingement syndrome of left shoulder: Secondary | ICD-10-CM

## 2015-11-29 DIAGNOSIS — R6889 Other general symptoms and signs: Secondary | ICD-10-CM

## 2015-11-29 DIAGNOSIS — M2560 Stiffness of unspecified joint, not elsewhere classified: Secondary | ICD-10-CM

## 2015-11-29 DIAGNOSIS — M7541 Impingement syndrome of right shoulder: Secondary | ICD-10-CM | POA: Diagnosis not present

## 2015-11-29 DIAGNOSIS — Z7409 Other reduced mobility: Secondary | ICD-10-CM

## 2015-11-29 NOTE — Patient Instructions (Signed)

## 2015-11-29 NOTE — Therapy (Signed)
[place], [place], [address] Phone: [phone]   Fax:  [phone]  Physical Therapy Treatment  Patient Details  Name: Kelli Rojas MRN: [id number] Date of Birth: 09-03-1956 Referring Provider: Dr. Suella Broad  Encounter Date: 11/29/2015      Rojas End of Session - 11/29/15 1652    Visit Number 5   Number of Visits 12   Date for Rojas Re-Evaluation 12/27/15   Rojas Start Time 5465   Rojas Stop Time 1752   Rojas Time Calculation (min) 62 min      Past Medical History  Diagnosis Date  . Diabetes mellitus without complication ([place])   . Depression   . Anxiety   . Thyroid disease   . Parkinson's disease ([place])   . Diabetic peripheral neuropathy [facility])     Past Surgical History  Procedure Laterality Date  . Lung cancer surgery    . Left heart catheterization with coronary angiogram N/A 01/02/2015    Procedure: LEFT HEART CATHETERIZATION WITH CORONARY ANGIOGRAM;  Surgeon: Adrian Prows, MD;  Location: [facility] CATH LAB;  Service: Cardiovascular;  Laterality: N/A;    There were no vitals filed for this visit.  Visit Diagnosis:  Cervical dysfunction  Impingement syndrome of both shoulders  Stiffness due to immobility  Weakness  Decreased strength, endurance, and mobility      Subjective Assessment - 11/29/15 1651    Subjective Shelsie reports her shoulder pain has settled back down. She has only been performing the stretches over the last couple of day.    Patient Stated Goals stop the pain    Currently in Pain? Yes   Pain Score 2    Pain Location Shoulder   Pain Orientation Right;Posterior   Pain Type Chronic pain                         [facility] Adult Rojas Treatment/Exercise - 11/29/15 0001    Shoulder Exercises: Supine   Other Supine Exercises shoulder presses and thoracic lifts.  x 10, VC to keep head in neutral    Shoulder Exercises: Standing   Other Standing Exercises leaning into  noodle, shoulder ext with yellow band , very light resistance.    Shoulder Exercises: ROM/Strengthening   UBE (Upper Arm Bike) L1x4' Alt FWD/BWD   Modalities   Modalities Electrical Stimulation;Moist Heat   Moist Heat Therapy   Number Minutes Moist Heat 15 Minutes   Moist Heat Location Shoulder  Rt deltoid   Electrical Stimulation   Electrical Stimulation Location Rt dletoid   Electrical Stimulation Action IFC   Electrical Stimulation Parameters  to tolerance   Electrical Stimulation Goals Pain;Tone   Manual Therapy   Manual Therapy Joint mobilization;Soft tissue mobilization   Manual therapy comments Rojas supine    Joint Mobilization Rt shoulder grade III post mobs with arm by side and abducted 60 degrees   Soft tissue mobilization Rt SCM, scalenes, upper trap, levator, and Rt deltoid   Manual Traction manual cervical traction   with sdie bend stretch.           Trigger Point Dry Needling - 11/29/15 1735    Consent Given? Yes   Education Handout Provided No   Muscles Treated Upper Body --  deltoid Rt - good twitch response and ms lengthening                   Rojas Long Term Goals - 11/29/15 1653  Rojas LONG TERM GOAL #1   Title Improve posture and alignment with Rojas to demonstrate upright posture and head and neck over body and shoulder blades down and back 12/27/15   Time 6   Period Weeks   Status On-going   Rojas LONG TERM GOAL #2   Title Improve cervical and shoulder AROM to [facility] 12/27/15   Status On-going   Rojas LONG TERM GOAL #3   Title Increase bilat UE strength to 4+/5 to 5/5 12/27/15   Status On-going   Rojas LONG TERM GOAL #4   Title Patient I in HEP 12/27/15   Status On-going   Rojas LONG TERM GOAL #5   Title Improve FOTO to </= 34% limitation 12/27/15   Status On-going               Plan - 11/29/15 1737    Clinical Impression Statement Minnie had significant trigger points in her Rt deltoid, responded well to TDN and manual work.  She tends to hold her  head tilited to the Rt, able to correct and hold with verbal cues.    Rojas will benefit from skilled therapeutic intervention in order to improve on the following deficits Postural dysfunction;Improper body mechanics;Decreased range of motion;Decreased mobility;Decreased endurance;Decreased activity tolerance;Pain   Rehab Potential Good   Rojas Frequency 2x / week   Rojas Duration 6 weeks   Rojas Treatment/Interventions Patient/family education;ADLs/Self Care Home Management;Therapeutic exercise;Therapeutic activities;Neuromuscular re-education;Manual techniques;Dry needling;Cryotherapy;Electrical Stimulation;Iontophoresis '4mg'$ /ml Dexamethasone;Moist Heat;Ultrasound   Rojas Next Visit Plan assess response to TDN and the one exercise with yellow band.    Consulted and Agree with Plan of Care Patient        Problem List Patient Active Problem List   Diagnosis Date Noted  . Chest pain with high risk for cardiac etiology 01/01/2015  . HYPERCHOLESTEROLEMIA 03/09/2009  . BRONCHITIS, RECURRENT 03/09/2009  . EMPHYSEMA 03/09/2009  . SLEEP APNEA 03/09/2009  . COUGH 03/09/2009    Kelli Rojas 11/29/2015, 5:40 PM  [facility] [place], [place], [address] Phone: [phone]   Fax:  [phone]  Name: Kelli Rojas MRN: [id number] Date of Birth: 1955-10-27

## 2015-12-03 ENCOUNTER — Encounter: Payer: Self-pay | Admitting: Rehabilitative and Restorative Service Providers"

## 2015-12-03 ENCOUNTER — Ambulatory Visit (INDEPENDENT_AMBULATORY_CARE_PROVIDER_SITE_OTHER): Payer: 59 | Admitting: Rehabilitative and Restorative Service Providers"

## 2015-12-03 DIAGNOSIS — M2560 Stiffness of unspecified joint, not elsewhere classified: Secondary | ICD-10-CM

## 2015-12-03 DIAGNOSIS — R531 Weakness: Secondary | ICD-10-CM | POA: Diagnosis not present

## 2015-12-03 DIAGNOSIS — M539 Dorsopathy, unspecified: Secondary | ICD-10-CM | POA: Diagnosis not present

## 2015-12-03 DIAGNOSIS — M7542 Impingement syndrome of left shoulder: Secondary | ICD-10-CM

## 2015-12-03 DIAGNOSIS — M7541 Impingement syndrome of right shoulder: Secondary | ICD-10-CM

## 2015-12-03 DIAGNOSIS — Z7409 Other reduced mobility: Secondary | ICD-10-CM

## 2015-12-03 DIAGNOSIS — M623 Immobility syndrome (paraplegic): Secondary | ICD-10-CM

## 2015-12-03 DIAGNOSIS — R6889 Other general symptoms and signs: Secondary | ICD-10-CM

## 2015-12-03 NOTE — Therapy (Addendum)
[place], [place], [address] Phone: [phone]   Fax:  [phone]  Physical Therapy Treatment  Patient Details  Name: Kelli Rojas MRN: [id number] Date of Birth: Nov 13, 1955 Referring Provider: Dr. Suella Broad   Encounter Date: 12/03/2015      PT End of Session - 12/03/15 1612    Visit Number 6   Number of Visits 12   Date for PT Re-Evaluation 12/27/15   PT Start Time 1600   PT Stop Time 1657   PT Time Calculation (min) 57 min   Activity Tolerance Patient tolerated treatment well      Past Medical History  Diagnosis Date  . Diabetes mellitus without complication ([place])   . Depression   . Anxiety   . Thyroid disease   . Parkinson's disease ([place])   . Diabetic peripheral neuropathy [facility])     Past Surgical History  Procedure Laterality Date  . Lung cancer surgery    . Left heart catheterization with coronary angiogram N/A 01/02/2015    Procedure: LEFT HEART CATHETERIZATION WITH CORONARY ANGIOGRAM;  Surgeon: Adrian Prows, MD;  Location: [facility] CATH LAB;  Service: Cardiovascular;  Laterality: N/A;    There were no vitals filed for this visit.  Visit Diagnosis:  Cervical dysfunction  Impingement syndrome of both shoulders  Stiffness due to immobility  Weakness  Decreased strength, endurance, and mobility      Subjective Assessment - 12/03/15 1612    Currently in Pain? No/denies   Pain Score --  does have pain with active abduction of Rt shoulder             [facility] PT Assessment - 12/03/15 0001    Assessment   Medical Diagnosis DDD cervical spine; bilat shd dysfunction/impingement   Referring Provider Dr. Suella Broad    Onset Date/Surgical Date 04/30/15   Hand Dominance Right   Next MD Visit 12/19/15   Prior Therapy none   AROM   Right Shoulder Flexion 138 Degrees   Right Shoulder ABduction 126 Degrees   Right Shoulder Internal Rotation 33 Degrees   Right Shoulder  External Rotation 85 Degrees   Left Shoulder Flexion 145 Degrees   Left Shoulder ABduction 129 Degrees   Left Shoulder Internal Rotation 31 Degrees   Left Shoulder External Rotation 80 Degrees                     [facility] Adult PT Treatment/Exercise - 12/03/15 0001    Shoulder Exercises: Standing   Extension Both;Theraband;20 reps   Theraband Level (Shoulder Extension) Level 1 (Yellow)   Row Both;10 reps;Theraband   Theraband Level (Shoulder Row) Level 1 (Yellow)   Retraction Both;10 reps;Theraband   Theraband Level (Shoulder Retraction) Level 1 (Yellow)   Other Standing Exercises scap squeeze 10 sec x 10    Shoulder Exercises: ROM/Strengthening   UBE (Upper Arm Bike) L1x4' Alt FWD/BWD   Shoulder Exercises: Stretch   Other Shoulder Stretches 3 way doorway 3 positions bilat 30 sec x 3 reps    Modalities   Modalities Electrical Stimulation;Moist Heat   Moist Heat Therapy   Number Minutes Moist Heat 15 Minutes   Moist Heat Location Shoulder  Rt deltoid   Electrical Stimulation   Electrical Stimulation Location Rt shoulder and dletoid   Electrical Stimulation Action IFC   Electrical Stimulation Parameters to tolerance   Electrical Stimulation Goals Pain;Tone   Manual Therapy   Manual Therapy Joint mobilization;Soft tissue mobilization  Manual therapy comments Pt supine    Joint Mobilization Rt shoulder grade III post mobs with arm by side and abducted 60 degrees   Soft tissue mobilization Rt cervical musculature pecs; deltoid; biceps    Myofascial Release Rt pecs/anterior shoulders                 PT Education - 12/03/15 1626    Education provided Yes   Education Details added theraband exercises for home    Person(s) Educated Patient   Methods Explanation;Demonstration;Tactile cues;Verbal cues;Handout   Comprehension Verbalized understanding;Returned demonstration;Verbal cues required;Tactile cues required             PT Long Term Goals - 11/29/15  1653    PT LONG TERM GOAL #1   Title Improve posture and alignment with pt to demonstrate upright posture and head and neck over body and shoulder blades down and back 12/27/15   Time 6   Period Weeks   Status On-going   PT LONG TERM GOAL #2   Title Improve cervical and shoulder AROM to [facility] 12/27/15   Status On-going   PT LONG TERM GOAL #3   Title Increase bilat UE strength to 4+/5 to 5/5 12/27/15   Status On-going   PT LONG TERM GOAL #4   Title Patient I in HEP 12/27/15   Status On-going   PT LONG TERM GOAL #5   Title Improve FOTO to </= 34% limitation 12/27/15   Status On-going               Plan - 12/03/15 1650    Clinical Impression Statement Good response to TDN and treatment. Improving ROOM and functional abilities as well as gains in bilat shd AROM. She continues to demonstrate end range limitations in bilat shd motions. Progressing well toward stated goals of therapy.    Pt will benefit from skilled therapeutic intervention in order to improve on the following deficits Postural dysfunction;Improper body mechanics;Decreased range of motion;Decreased mobility;Decreased endurance;Decreased activity tolerance;Pain   Rehab Potential Good   PT Frequency 2x / week   PT Duration 6 weeks   PT Treatment/Interventions Patient/family education;ADLs/Self Care Home Management;Therapeutic exercise;Therapeutic activities;Neuromuscular re-education;Manual techniques;Dry needling;Cryotherapy;Electrical Stimulation;Iontophoresis 77m/ml Dexamethasone;Moist Heat;Ultrasound   PT Next Visit Plan continue  TDN as indicated; assess response to theraband exercises; progress with trial of biceps stretch; pulley for shd ROM    PT Home Exercise Plan myofacial ball release work; HEP including theraband exercises    Consulted and Agree with Plan of Care Patient        Problem List Patient Active Problem List   Diagnosis Date Noted  . Chest pain with high risk for cardiac etiology 01/01/2015  .  HYPERCHOLESTEROLEMIA 03/09/2009  . BRONCHITIS, RECURRENT 03/09/2009  . EMPHYSEMA 03/09/2009  . SLEEP APNEA 03/09/2009  . COUGH 03/09/2009    Dalma Panchal PNilda SimmerPT, MPH  12/03/2015, 4:55 PM  [facility] [address] [address]Phone: [phone]  Fax:  [phone] Name: CDIERA WIRKKALAMRN: [id number] of Birth: [birth year]   PHYSICAL THERAPY DISCHARGE SUMMARY  Visits from Start of Care: 6  Current functional level related to goals / functional outcomes: Progressing well with improved cervical mobility and ROM as well as decrease in pain and improvement in functional activity level   Remaining deficits: Continued pain on an intermittent basis   Education / Equipment: HEP  Plan: Patient agrees to discharge.  Patient goals were partially met. Patient  is being discharged due to the patient's request.  ?????    Maryann Mccall P. Helene Kelp PT, MPH 12/12/2015 8:37 AM

## 2015-12-03 NOTE — Patient Instructions (Signed)
Resisted External Rotation: in Neutral - Bilateral   PALMS UP Sit or stand, tubing in both hands, elbows at sides, bent to 90, forearms forward. Pinch shoulder blades together and rotate forearms out. Keep elbows at sides. Repeat __10__ times per set. Do _2-3___ sets per session. Do _2-3___ sessions per day.   Low Row: Standing   Face anchor, feet shoulder width apart. Palms up, pull arms back, squeezing shoulder blades together. Repeat 10__ times per set. Do 2-3__ sets per session. Do 2-3__ sessions per week. Anchor Height: Waist     Strengthening: Resisted Extension   Hold tubing in right hand, arm forward. Pull arm back, elbow straight. Repeat _10___ times per set. Do 2-3____ sets per session. Do 2-3____ sessions per day.    Strengthening: Isometric Abduction    Using wall for resistance, press left arm into ball using light pressure. Hold __5-10__ seconds. Repeat __10__ times per set. Do _1-2__ sets per session. Do __2__ sessions per day.  Lying on back on foam roll with arms at sides in position of gentle stretch  Hold for 2-5 min  If you need a break bend elbows and then strighten arms out again.

## 2015-12-06 ENCOUNTER — Encounter: Payer: 59 | Admitting: Physical Therapy

## 2015-12-25 ENCOUNTER — Ambulatory Visit: Payer: 59 | Admitting: Neurology

## 2016-03-14 DIAGNOSIS — H25041 Posterior subcapsular polar age-related cataract, right eye: Secondary | ICD-10-CM | POA: Insufficient documentation

## 2016-06-06 ENCOUNTER — Ambulatory Visit (INDEPENDENT_AMBULATORY_CARE_PROVIDER_SITE_OTHER): Payer: 59 | Admitting: Rehabilitative and Restorative Service Providers"

## 2016-06-06 ENCOUNTER — Encounter: Payer: Self-pay | Admitting: Rehabilitative and Restorative Service Providers"

## 2016-06-06 DIAGNOSIS — R293 Abnormal posture: Secondary | ICD-10-CM | POA: Diagnosis not present

## 2016-06-06 DIAGNOSIS — M2560 Stiffness of unspecified joint, not elsewhere classified: Secondary | ICD-10-CM

## 2016-06-06 DIAGNOSIS — Z7409 Other reduced mobility: Secondary | ICD-10-CM

## 2016-06-06 DIAGNOSIS — M623 Immobility syndrome (paraplegic): Secondary | ICD-10-CM | POA: Diagnosis not present

## 2016-06-06 DIAGNOSIS — R29898 Other symptoms and signs involving the musculoskeletal system: Secondary | ICD-10-CM

## 2016-06-06 DIAGNOSIS — M6281 Muscle weakness (generalized): Secondary | ICD-10-CM | POA: Diagnosis not present

## 2016-06-06 DIAGNOSIS — M25511 Pain in right shoulder: Secondary | ICD-10-CM

## 2016-06-06 NOTE — Patient Instructions (Addendum)
AROM: Finger Flexion / Extension    Actively bend fingers of right hand. Start with knuckles furthest from palm, and slowly make a fist. Hold __5__ seconds. Relax. Then straighten fingers as far as possible. Repeat __5-10__ times per set.  Do __2-3__ sessions per day.   Finger Opposition    Actively touch right thumb to each fingertip. Start with index finger and proceed toward little finger. Move slowly at first, then more rapidly as motion and coordination improve. Be sure to touch each fingertip. Repeat __10__ times per set.  Do __2-3__ sessions per day.   AROM: Wrist Flexion / Extension    Actively bend right wrist forward then back as far as possible. Repeat _3-5___ times per set. Do _2-3___ sessions per day. Making circles with wrist as well   Forearm Supination Stretch    With right hand in handshake position, grasp and slowly turn to palm up until stretch is felt. Hold __2-3__ seconds. Relax. Repeat __10__ times per set. Do _2-3___ sessions per day.    AROM: Elbow Flexion / Extension    With left hand palm up, gently bend elbow as far as possible. Then straighten arm as far as possible. Repeat __3-5__ times per set. Do _2-3___ sessions per day.   ROM: Pendulum (Circular)    Let right arm move in circle clockwise, then counterclockwise, by rocking body weight in circular pattern. Circle _30___ times each direction per set. Do _1-2___ sets per session. Do _3-4___ sessions per day.   ROM: Flexion    Keeping left arm on table, slide body away until stretch is felt. Hold __10__ seconds. Repeat _10___ times per set. Do __3-4__ sessions per day.     ROM: External / Internal Rotation - Wand    Holding wand with left hand palm up, push out from body with other hand, palm down. Keep both elbows bent. When stretch is felt, hold ___10_ seconds. Repeat to other side, leading with same hand. Keep elbows bent. Repeat ___10_ times per set.  Do __3-4__ sessions  per day.   Shoulder Blade Squeeze    Rotate shoulders back, then squeeze shoulder blades down and back. Hold 10 sec Repeat _10___ times. Do _several___ sessions per day.

## 2016-06-06 NOTE — Therapy (Signed)
[facility] [employer] [place], [place], [address] Phone: [phone]   Fax:  [phone]  Physical Therapy Evaluation  Patient Details  Name: Kelli Rojas MRN: [id number] Date of Birth: June 20, 1956 Referring Provider: Dr Kelli Rojas  Encounter Date: 06/06/2016      PT End of Session - 06/06/16 1108    Visit Number 1   Number of Visits 18   Date for PT Re-Evaluation 07/18/16   PT Start Time 1029  pt arrived late, completling intake forms - late start for eval   PT Stop Time 1110   PT Time Calculation (min) 41 min   Activity Tolerance Patient tolerated treatment well      Past Medical History:  Diagnosis Date  . Anxiety   . Depression   . Diabetes mellitus without complication ([place])   . Diabetic peripheral neuropathy ([place])   . Parkinson's disease ([place])   . Thyroid disease     Past Surgical History:  Procedure Laterality Date  . LEFT HEART CATHETERIZATION WITH CORONARY ANGIOGRAM N/A 01/02/2015   Procedure: LEFT HEART CATHETERIZATION WITH CORONARY ANGIOGRAM;  Surgeon: Kelli Prows, MD;  Location[facility] C[facility] LAB;  Service: Cardiovascular;  Laterality: N/A;  . LUNG CANCER SURGERY      There were no vitals filed for this visit.       Subjective Assessment - 06/06/16 1029    Subjective Patient reports that she has been having Rt shoudler and arm pain over the past 8 monthjs with no known injury. She was seen for PT for 4-6 weeks 2/17 to 3/17 with no improvement. She continued to have pain and limited function and was scheduled for surgery 06/05/16.   Diagnostic tests xray; MRI   Patient Stated Goals get well and use arm again   Currently in Pain? Yes   Pain Score 0-No pain   Pain Location Shoulder   Pain Orientation Right   Pain Type Surgical pain   Pain Onset More than a month ago   Pain Frequency Intermittent   Aggravating Factors  moving or using arm    Pain Relieving Factors pain meds              [facility] PT Assessment - 06/06/16 0001      Assessment   Medical Diagnosis Rt shoulder DCR/SAD    Referring Provider Dr Kelli Rojas   Onset Date/Surgical Date 06/06/16   Hand Dominance Right   Next MD Visit 06/13/16   Prior Therapy yes      Precautions   Precautions None     Balance Screen   Has the patient fallen in the past 6 months No   Has the patient had a decrease in activity level because of a fear of falling?  No   Is the patient reluctant to leave their home because of a fear of falling?  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    Leisure household chores      Observation/Other Assessments   Observations patient presents with Rt UE in sling with large post op dressing in place - no drainage noted    Focus on Therapeutic Outcomes (FOTO)  75% limitation      Sensation   Additional Comments WFL's per pt report  Posture/Postural Control   Posture Comments head forward; shoulders rounded and eelvated; incresaed thoracic kyphosis; scapulae abducted and rotated along the thoracic wall     AROM   Overall AROM Comments AROM not tested Rt shoudler - Rt elbow, wrist, hand are WFL's forearm supination limitd at ~ 70 degrees pronation WFL's    Left Shoulder Extension 45 Degrees   Left Shoulder Flexion 141 Degrees   Left Shoulder ABduction 152 Degrees   Left Shoulder Internal Rotation 33 Degrees   Left Shoulder External Rotation 84 Degrees     Strength   Overall Strength Comments Lt UE - WFL's Rt not tested                    [facility] Adult PT Treatment/Exercise - 06/06/16 0001      Self-Care   Self-Care --  education re sling and positioning for Rt UE     Neuro Re-ed    Neuro Re-ed Details  postural education      Shoulder Exercises: Standing    Retraction AROM;Both;10 reps  with swim noodle along spine    Other Standing Exercises ER with cane 10 sec x 10    Other Standing Exercises fisting; wrist flex/ext; forearm supination/pronation; elbow flex/ext;      Shoulder Exercises: Stretch   Table Stretch - Flexion --  10 reps x 10 sec hold    Other Shoulder Stretches pendulum 30CW; 30 CCW     Cryotherapy   Number Minutes Cryotherapy 10 Minutes   Cryotherapy Location Shoulder  Rt   Type of Cryotherapy Ice pack                PT Education - 06/06/16 1108    Education provided Yes   Education Details HEP positioning and use of sling    Person(s) Educated Patient   Methods Explanation;Demonstration;Tactile cues;Verbal cues;Handout   Comprehension Verbalized understanding;Returned demonstration;Verbal cues required;Tactile cues required             PT Long Term Goals - 06/06/16 1251      PT LONG TERM GOAL #1   Title Improve posture and alignment with pt to demonstrate upright posture and head and neck over body and shoulder blades down and back 07/18/16   Time 6   Period Weeks   Status New     PT LONG TERM GOAL #2   Title Improve Rt shoulder AROM to equal or greater than Lt 07/18/16   Time 6   Period Weeks   Status New     PT LONG TERM GOAL #3   Title Increase Rt UE strength to 4/5 to 5-/5   07/18/16   Time 6   Period Weeks   Status New     PT LONG TERM GOAL #4   Title Patient I in HEP 07/18/16   Time 6   Period Weeks   Status New     PT LONG TERM GOAL #5   Title Improve FOTO to </= 45% limitation 07/18/16   Time 6   Period Weeks   Status New               Plan - 06/06/16 1245    Clinical Impression Statement Patient presents s/p Rt shoulder DCR/SAD 06/06/16. She is in heavy post op bandaging which is dry and intact. Patient will remove dressing Sunday 06/08/16 and returns to MD Friday 06/13/16. Rt shoulder is elevated with shoudlers rounded and arm in sling at side. Kelli Rojas has limited ROM;  functional strength; ADL's in Rt UE.    Rehab Potential Good   PT Frequency Other (comment)  5 days/week, decreasing to 3 x/wk then 2 x wk as patient progresses    PT Duration 6 weeks   PT Treatment/Interventions Patient/family education;ADLs/Self Care Home Management;Neuromuscular re-education;Cryotherapy;Electrical Stimulation;Iontophoresis '4mg'$ /ml Dexamethasone;Moist Heat;Ultrasound;Manual techniques;Dry needling;Therapeutic activities;Therapeutic exercise   PT Next Visit Plan progress with AAROM for HEP; work on PROM in clinic; progress with rehab as indicated including use of modalities as indicated   Consulted and Agree with Plan of Care Patient      Patient will benefit from skilled therapeutic intervention in order to improve the following deficits and impairments:  Postural dysfunction, Improper body mechanics, Pain, Decreased range of motion, Decreased mobility, Decreased strength, Impaired UE functional use, Decreased activity tolerance  Visit Diagnosis: Pain in right shoulder - Plan: PT plan of care cert/re-cert  Abnormal posture - Plan: PT plan of care cert/re-cert  Stiffness due to immobility - Plan: PT plan of care cert/re-cert  Muscle weakness (generalized) - Plan: PT plan of care cert/re-cert  Other symptoms and signs involving the musculoskeletal system - Plan: PT plan of care cert/re-cert     Problem List Patient Active Problem List   Diagnosis Date Noted  . Chest pain with high risk for cardiac etiology 01/01/2015  . HYPERCHOLESTEROLEMIA 03/09/2009  . BRONCHITIS, RECURRENT 03/09/2009  . EMPHYSEMA 03/09/2009  . SLEEP APNEA 03/09/2009  . COUGH 03/09/2009    Michon Kaczmarek [name] PT, MPH  06/06/2016, 12:59 PM  [facility] [place], [place], [address] Phone: [phone]   Fax:  [phone]  Name: Kelli Rojas MRN: [id number] Date of Birth: [birth year]

## 2016-06-09 ENCOUNTER — Encounter: Payer: Self-pay | Admitting: Rehabilitative and Restorative Service Providers"

## 2016-06-09 ENCOUNTER — Ambulatory Visit (INDEPENDENT_AMBULATORY_CARE_PROVIDER_SITE_OTHER): Payer: 59 | Admitting: Rehabilitative and Restorative Service Providers"

## 2016-06-09 DIAGNOSIS — M6281 Muscle weakness (generalized): Secondary | ICD-10-CM

## 2016-06-09 DIAGNOSIS — M25511 Pain in right shoulder: Secondary | ICD-10-CM

## 2016-06-09 DIAGNOSIS — R29898 Other symptoms and signs involving the musculoskeletal system: Secondary | ICD-10-CM

## 2016-06-09 DIAGNOSIS — R293 Abnormal posture: Secondary | ICD-10-CM

## 2016-06-09 DIAGNOSIS — M623 Immobility syndrome (paraplegic): Secondary | ICD-10-CM | POA: Diagnosis not present

## 2016-06-09 DIAGNOSIS — M2560 Stiffness of unspecified joint, not elsewhere classified: Secondary | ICD-10-CM

## 2016-06-09 DIAGNOSIS — Z7409 Other reduced mobility: Secondary | ICD-10-CM

## 2016-06-09 NOTE — Therapy (Signed)
[place], [place], [address] Phone: [phone]   Fax:  [phone]  Physical Therapy Treatment  Patient Details  Name: Kelli Rojas MRN: [id number] Date of Birth: [birth year] Referring Provider: Dr Sydnee Cabal  Encounter Date: 06/09/2016      PT End of Session - 06/09/16 0720    Visit Number 2   Number of Visits 18   Date for PT Re-Evaluation 07/18/16   PT Start Time 0716   PT Stop Time 0805   PT Time Calculation (min) 49 min   Activity Tolerance Patient tolerated treatment well      Past Medical History:  Diagnosis Date  . Anxiety   . Depression   . Diabetes mellitus without complication ([place])   . Diabetic peripheral neuropathy ([place])   . Parkinson's disease ([place])   . Thyroid disease     Past Surgical History:  Procedure Laterality Date  . LEFT HEART CATHETERIZATION WITH CORONARY ANGIOGRAM N/A 01/02/2015   Procedure: LEFT HEART CATHETERIZATION WITH CORONARY ANGIOGRAM;  Surgeon: Adrian Prows, MD;  Location: [facility] CATH LAB;  Service: Cardiovascular;  Laterality: N/A;  . LUNG CANCER SURGERY      There were no vitals filed for this visit.      Subjective Assessment - 06/09/16 0723    Subjective Doing Ok - working on her exercises at home. Taking pain meds as needed but less frequently. No meds this am - heading to work.    Currently in Pain? Yes   Pain Score 4    Pain Location Shoulder   Pain Orientation Right   Pain Descriptors / Indicators Sharp   Pain Type Surgical pain   Pain Onset 1 to 4 weeks ago   Pain Frequency Intermittent                         [facility] Adult PT Treatment/Exercise - 06/09/16 0001      Therapeutic Activites    Therapeutic Activities --  self massage using 4 inch ball Rt shd girdle      Neuro Re-ed    Neuro Re-ed Details  postural education      Shoulder Exercises: Standing   Retraction AROM;Both;10 reps  with swim noodle along spine     Other Standing Exercises ER with cane 10 sec x 10      Shoulder Exercises: Pulleys   Flexion --  10 x 10 sec   ABduction --  10 x 10 sec      Shoulder Exercises: Therapy Ball   Flexion 5 reps     Shoulder Exercises: Stretch   Internal Rotation Stretch 5 reps  10 sec using strap pulling arm across buttocks   Table Stretch - Flexion --  10 reps x 10 sec hold    Other Shoulder Stretches pendulum 30CW; 30 CCW   Other Shoulder Stretches shd extension with cane 10 sec x 5      Manual Therapy   Joint Mobilization circumductioin Rt GH jt pt sitting    Soft tissue mobilization upper trap; leveator; deptoid Rt    Passive ROM PROM Rt shd flex/ext/ER/IR pt sitting                 PT Education - 06/09/16 0739    Education provided Yes   Education Details HEP   Person(s) Educated Patient   Methods Explanation;Demonstration;Tactile cues;Verbal cues;Handout   Comprehension Verbalized understanding;Returned demonstration;Verbal cues required;Tactile cues required  PT Long Term Goals - 06/09/16 0720      PT LONG TERM GOAL #1   Title Improve posture and alignment with pt to demonstrate upright posture and head and neck over body and shoulder blades down and back 07/18/16   Time 6   Period Weeks   Status On-going     PT LONG TERM GOAL #2   Title Improve Rt shoulder AROM to equal or greater than Lt 07/18/16   Time 6   Period Weeks   Status On-going     PT LONG TERM GOAL #3   Title Increase Rt UE strength to 4/5 to 5-/5   07/18/16   Time 6   Period Weeks   Status On-going     PT LONG TERM GOAL #4   Title Patient I in HEP 07/18/16   Time 6   Period Weeks   Status On-going     PT LONG TERM GOAL #5   Title Improve FOTO to </= 45% limitation 07/18/16   Time 6   Period Weeks   Status On-going               Plan - 06/09/16 0740    Clinical Impression Statement Patient tolerated additional exercises with minimal dicfficulty. She moves  cautiously, with guraded movement patterns.    Rehab Potential Good   PT Frequency Other (comment)   PT Duration 6 weeks   PT Treatment/Interventions Patient/family education;ADLs/Self Care Home Management;Neuromuscular re-education;Cryotherapy;Electrical Stimulation;Iontophoresis '4mg'$ /ml Dexamethasone;Moist Heat;Ultrasound;Manual techniques;Dry needling;Therapeutic activities;Therapeutic exercise   PT Next Visit Plan progress with AAROM for HEP; work on PROM in clinic; progress with rehab as indicated including use of modalities as indicated   Consulted and Agree with Plan of Care Patient      Patient will benefit from skilled therapeutic intervention in order to improve the following deficits and impairments:  Postural dysfunction, Improper body mechanics, Pain, Decreased range of motion, Decreased mobility, Decreased strength, Impaired UE functional use, Decreased activity tolerance  Visit Diagnosis: Pain in right shoulder  Abnormal posture  Stiffness due to immobility  Muscle weakness (generalized)  Other symptoms and signs involving the musculoskeletal system     Problem List Patient Active Problem List   Diagnosis Date Noted  . Chest pain with high risk for cardiac etiology 01/01/2015  . HYPERCHOLESTEROLEMIA 03/09/2009  . BRONCHITIS, RECURRENT 03/09/2009  . EMPHYSEMA 03/09/2009  . SLEEP APNEA 03/09/2009  . COUGH 03/09/2009    Kelli Rojas PT, MPH  06/09/2016, 8:00 AM  [facility] [address] [place], [place], [address] Phone: [phone]   Fax:  [phone]  Name: Kelli Rojas MRN: [id number] Date of Birth: 09-01-1956

## 2016-06-09 NOTE — Patient Instructions (Addendum)
Internal Rotator Cuff Stretch, Standing (Passive)    Stand and bring hand behind back, using other hand to assist. Hold __10_ seconds. Repeat _5-10__ times per session. Do _3-4__ sessions per day.    Anterior Capsule Stretch, Standing    Stand holding stick behind back, hands palms up. Lift stick away from body as far as possible. Hold _10__ seconds. Repeat _5-10__ times per session. Do _3-4__ sessions per day.     Anterior Capsule Stretch, Standing    Stand, hands clasped behind back. Raise arms backward and upward as far as possible. Hold _10__ seconds. Repeat _5-10__ times per session. Do _3-4__ sessions per day.   Lat Stretch, Standing    Stand and place palms resting on counter top, shoulder-width apart. Lean upper body forward and push lower back outward. Hold _10-20__ seconds. Repeat __

## 2016-06-10 ENCOUNTER — Ambulatory Visit (INDEPENDENT_AMBULATORY_CARE_PROVIDER_SITE_OTHER): Payer: 59 | Admitting: Rehabilitative and Restorative Service Providers"

## 2016-06-10 DIAGNOSIS — M25511 Pain in right shoulder: Secondary | ICD-10-CM | POA: Diagnosis not present

## 2016-06-10 DIAGNOSIS — M2560 Stiffness of unspecified joint, not elsewhere classified: Secondary | ICD-10-CM

## 2016-06-10 DIAGNOSIS — Z7409 Other reduced mobility: Secondary | ICD-10-CM

## 2016-06-10 DIAGNOSIS — R29898 Other symptoms and signs involving the musculoskeletal system: Secondary | ICD-10-CM

## 2016-06-10 DIAGNOSIS — R293 Abnormal posture: Secondary | ICD-10-CM | POA: Diagnosis not present

## 2016-06-10 DIAGNOSIS — M623 Immobility syndrome (paraplegic): Secondary | ICD-10-CM | POA: Diagnosis not present

## 2016-06-10 DIAGNOSIS — M6281 Muscle weakness (generalized): Secondary | ICD-10-CM

## 2016-06-10 NOTE — Therapy (Signed)
[place], [place], [address] Phone: [phone]   Fax:  [phone]  Physical Therapy Treatment  Patient Details  Name: Kelli Rojas MRN: [id number] Date of Birth: 1956-07-21 Referring Provider: Dr Sydnee Cabal  Encounter Date: 06/10/2016      PT End of Session - 06/10/16 0808    Visit Number 3   Number of Visits 18   Date for PT Re-Evaluation 07/18/16   PT Start Time 0805   PT Stop Time 0900   PT Time Calculation (min) 55 min   Activity Tolerance Patient tolerated treatment well      Past Medical History:  Diagnosis Date  . Anxiety   . Depression   . Diabetes mellitus without complication ([place])   . Diabetic peripheral neuropathy ([place])   . Parkinson's disease ([place])   . Thyroid disease     Past Surgical History:  Procedure Laterality Date  . LEFT HEART CATHETERIZATION WITH CORONARY ANGIOGRAM N/A 01/02/2015   Procedure: LEFT HEART CATHETERIZATION WITH CORONARY ANGIOGRAM;  Surgeon: Adrian Prows, MD;  Location: [facility] CATH LAB;  Service: Cardiovascular;  Laterality: N/A;  . LUNG CANCER SURGERY      There were no vitals filed for this visit.      Subjective Assessment - 06/10/16 0808    Subjective Made it until 11:30 am yesterday at work - then went home. Having more pain yesterday at work and pain has continued.    Currently in Pain? Yes   Pain Score 7    Pain Location Shoulder   Pain Orientation Right   Pain Descriptors / Indicators Sharp   Pain Type Surgical pain   Pain Onset 1 to 4 weeks ago            [facility] PT Assessment - 06/10/16 0001      Assessment   Medical Diagnosis Rt shoulder DCR/SAD    Referring Provider Dr Sydnee Cabal   Onset Date/Surgical Date 06/06/16   Hand Dominance Right   Next MD Visit 06/13/16   Prior Therapy yes      PROM   Right/Left Shoulder --  pt supine - ROM in scapular plane   Right Shoulder Flexion 131 Degrees   Right Shoulder ABduction  122 Degrees   Right Shoulder Internal Rotation 58 Degrees   Right Shoulder External Rotation 72 Degrees                     [facility] Adult PT Treatment/Exercise - 06/10/16 0001      Shoulder Exercises: Standing   Retraction AROM;Both;10 reps  with swim noodle along spine    Other Standing Exercises ER with cane 10 sec x 10      Shoulder Exercises: Pulleys   Flexion --  10 x 10 sec   ABduction --  10 x 10 sec      Shoulder Exercises: Therapy Ball   Flexion 5 reps  rolling ball on table     Shoulder Exercises: Stretch   Internal Rotation Stretch 5 reps  10 sec using strap pulling arm across buttocks   Other Shoulder Stretches pendulum 30CW; 30 CCW     Electrical Stimulation   Electrical Stimulation Location Rt shoulder   Electrical Stimulation Action IFC   Electrical Stimulation Parameters to tolerance   Electrical Stimulation Goals Pain;Tone     Vasopneumatic   Number Minutes Vasopneumatic  15 minutes   Vasopnuematic Location  Shoulder  Rt   Vasopneumatic Pressure Low  Vasopneumatic Temperature  3*     Manual Therapy   Joint Mobilization GH mobs grade II   Soft tissue mobilization upper trap; leveator; deltoid, pecs Rt    Passive ROM PROM Rt shd flex/ext/ER/IR pt supine                PT Education - 06/09/16 0739    Education provided Yes   Education Details HEP   Person(s) Educated Patient   Methods Explanation;Demonstration;Tactile cues;Verbal cues;Handout   Comprehension Verbalized understanding;Returned demonstration;Verbal cues required;Tactile cues required             PT Long Term Goals - 06/09/16 0720      PT LONG TERM GOAL #1   Title Improve posture and alignment with pt to demonstrate upright posture and head and neck over body and shoulder blades down and back 07/18/16   Time 6   Period Weeks   Status On-going     PT LONG TERM GOAL #2   Title Improve Rt shoulder AROM to equal or greater than Lt 07/18/16   Time 6    Period Weeks   Status On-going     PT LONG TERM GOAL #3   Title Increase Rt UE strength to 4/5 to 5-/5   07/18/16   Time 6   Period Weeks   Status On-going     PT LONG TERM GOAL #4   Title Patient I in HEP 07/18/16   Time 6   Period Weeks   Status On-going     PT LONG TERM GOAL #5   Title Improve FOTO to </= 45% limitation 07/18/16   Time 6   Period Weeks   Status On-going               Plan - 06/10/16 0845    Clinical Impression Statement Increased pain following work part day yesterday. Tolerated PROM with minimal difficulty - c/o pain. Good PROM in supine. Decreased pain following modalities.    Rehab Potential Good   PT Frequency Other (comment)   PT Duration 6 weeks   PT Treatment/Interventions Patient/family education;ADLs/Self Care Home Management;Neuromuscular re-education;Cryotherapy;Electrical Stimulation;Iontophoresis '4mg'$ /ml Dexamethasone;Moist Heat;Ultrasound;Manual techniques;Dry needling;Therapeutic activities;Therapeutic exercise   PT Next Visit Plan progress with AAROM for HEP; work on PROM in clinic; progress with rehab as indicated including use of modalities as indicated   Consulted and Agree with Plan of Care Patient      Patient will benefit from skilled therapeutic intervention in order to improve the following deficits and impairments:  Postural dysfunction, Improper body mechanics, Pain, Decreased range of motion, Decreased mobility, Decreased strength, Impaired UE functional use, Decreased activity tolerance  Visit Diagnosis: Pain in right shoulder  Abnormal posture  Stiffness due to immobility  Muscle weakness (generalized)  Other symptoms and signs involving the musculoskeletal system     Problem List Patient Active Problem List   Diagnosis Date Noted  . Chest pain with high risk for cardiac etiology 01/01/2015  . HYPERCHOLESTEROLEMIA 03/09/2009  . BRONCHITIS, RECURRENT 03/09/2009  . EMPHYSEMA 03/09/2009  . SLEEP APNEA  03/09/2009  . COUGH 03/09/2009    [name] [name] PT, MPH  06/10/2016, 8:48 AM  [facility] [place], [place], [address] Phone: [phone]   Fax:  [phone]  Name: Kelli Rojas MRN: [id number] Date of Birth: 04-19-1956

## 2016-06-11 ENCOUNTER — Encounter: Payer: Self-pay | Admitting: Rehabilitative and Restorative Service Providers"

## 2016-06-11 ENCOUNTER — Ambulatory Visit (INDEPENDENT_AMBULATORY_CARE_PROVIDER_SITE_OTHER): Payer: 59 | Admitting: Rehabilitative and Restorative Service Providers"

## 2016-06-11 DIAGNOSIS — R293 Abnormal posture: Secondary | ICD-10-CM | POA: Diagnosis not present

## 2016-06-11 DIAGNOSIS — M25511 Pain in right shoulder: Secondary | ICD-10-CM | POA: Diagnosis not present

## 2016-06-11 DIAGNOSIS — M623 Immobility syndrome (paraplegic): Secondary | ICD-10-CM

## 2016-06-11 DIAGNOSIS — Z7409 Other reduced mobility: Secondary | ICD-10-CM

## 2016-06-11 DIAGNOSIS — M6281 Muscle weakness (generalized): Secondary | ICD-10-CM | POA: Diagnosis not present

## 2016-06-11 DIAGNOSIS — R29898 Other symptoms and signs involving the musculoskeletal system: Secondary | ICD-10-CM

## 2016-06-11 DIAGNOSIS — M2560 Stiffness of unspecified joint, not elsewhere classified: Secondary | ICD-10-CM

## 2016-06-11 NOTE — Therapy (Signed)
[place], [place], [address] Phone: [phone]   Fax:  [phone]  Physical Therapy Treatment  Patient Details  Name: Kelli Rojas MRN: [id number] Date of Birth: 1956-09-09 Referring Provider: Dr Sydnee Cabal  Encounter Date: 06/11/2016      PT End of Session - 06/11/16 0805    Visit Number 4   Number of Visits 18   Date for PT Re-Evaluation 07/18/16   PT Start Time 0803   PT Stop Time 0900   PT Time Calculation (min) 57 min   Activity Tolerance Patient tolerated treatment well      Past Medical History:  Diagnosis Date  . Anxiety   . Depression   . Diabetes mellitus without complication ([place])   . Diabetic peripheral neuropathy ([place])   . Parkinson's disease ([place])   . Thyroid disease     Past Surgical History:  Procedure Laterality Date  . LEFT HEART CATHETERIZATION WITH CORONARY ANGIOGRAM N/A 01/02/2015   Procedure: LEFT HEART CATHETERIZATION WITH CORONARY ANGIOGRAM;  Surgeon: Adrian Prows, MD;  Location: [facility] CATH LAB;  Service: Cardiovascular;  Laterality: N/A;  . LUNG CANCER SURGERY      There were no vitals filed for this visit.      Subjective Assessment - 06/11/16 0806    Subjective Made it until 11:30 am again yesterday at work - then went home. Having more pain yesterday at work and pain has continued. Hurts "in the top". Taking pain meds at home and TENS unit but has not tried using ice at home - suggested she add ice for home management.    Currently in Pain? Yes   Pain Score 3    Pain Location Shoulder   Pain Orientation Right   Pain Descriptors / Indicators Dull   Pain Type Surgical pain   Pain Onset 1 to 4 weeks ago   Pain Frequency Intermittent                         [facility] Adult PT Treatment/Exercise - 06/11/16 0001      Shoulder Exercises: Standing   External Rotation AROM;Both;10 reps   Internal Rotation AAROM;Right;10 reps  pulling Rt hand  across buttocks w/ Lt    Internal Rotation Limitations IR with strap 20 sec hold x 5    Flexion --  wall slide x 5; wall ladder x1(liked wall slide better)    Extension AAROM;Both;10 reps  with cane   Retraction AROM;Both;10 reps  with swim noodle along spine    Other Standing Exercises ER with cane 10 sec x 10    Other Standing Exercises shoulder rolls x 10-20      Shoulder Exercises: Pulleys   Flexion --  10 x 10 sec   ABduction --  10 x 10 sec      Shoulder Exercises: ROM/Strengthening   UBE (Upper Arm Bike) L1 using Lt UE to move - Rt UE ROM x 3 min alt fwd/back      Shoulder Exercises: Stretch   Other Shoulder Stretches pendulum 30CW; 30 CCW     Electrical Stimulation   Electrical Stimulation Location Rt shoulder   Electrical Stimulation Action IFC   Electrical Stimulation Parameters to tolerance   Electrical Stimulation Goals Pain;Tone     Vasopneumatic   Number Minutes Vasopneumatic  15 minutes   Vasopnuematic Location  Shoulder  Rt   Vasopneumatic Pressure Low   Vasopneumatic Temperature  3*  Manual Therapy   Manual therapy comments pt supine    Joint Mobilization [place] mobs grade II   Soft tissue mobilization upper trap; leveator; deltoid, pecs Rt    Passive ROM PROM Rt shd flex/ext/ER/IR/extension pt supine                PT Education - 06/11/16 0853    Education provided Yes   Education Details HEP    Person(s) Educated Patient   Methods Explanation;Demonstration;Tactile cues;Verbal cues;Handout   Comprehension Verbalized understanding;Returned demonstration;Verbal cues required;Tactile cues required             PT Long Term Goals - 06/09/16 0720      PT LONG TERM GOAL #1   Title Improve posture and alignment with pt to demonstrate upright posture and head and neck over body and shoulder blades down and back 07/18/16   Time 6   Period Weeks   Status On-going     PT LONG TERM GOAL #2   Title Improve Rt shoulder AROM to equal or  greater than Lt 07/18/16   Time 6   Period Weeks   Status On-going     PT LONG TERM GOAL #3   Title Increase Rt UE strength to 4/5 to 5-/5   07/18/16   Time 6   Period Weeks   Status On-going     PT LONG TERM GOAL #4   Title Patient I in HEP 07/18/16   Time 6   Period Weeks   Status On-going     PT LONG TERM GOAL #5   Title Improve FOTO to </= 45% limitation 07/18/16   Time 6   Period Weeks   Status On-going               Plan - 06/11/16 1001    Clinical Impression Statement Continued pain with work day and with therapy. Pain intensity, frequency and duration is decreasing. Sutures are intact and healing well at scope sites. Good gains in PROM. Progressing well toward stated goals of therapy.    Rehab Potential Good   PT Frequency Other (comment)   PT Duration 6 weeks   PT Treatment/Interventions Patient/family education;ADLs/Self Care Home Management;Neuromuscular re-education;Cryotherapy;Electrical Stimulation;Iontophoresis '4mg'$ /ml Dexamethasone;Moist Heat;Ultrasound;Manual techniques;Dry needling;Therapeutic activities;Therapeutic exercise   PT Next Visit Plan progress with AAROM for HEP; work on PROM in clinic; progress with rehab as indicated including use of modalities as indicated   Consulted and Agree with Plan of Care Patient      Patient will benefit from skilled therapeutic intervention in order to improve the following deficits and impairments:  Postural dysfunction, Improper body mechanics, Pain, Decreased range of motion, Decreased mobility, Decreased strength, Impaired UE functional use, Decreased activity tolerance  Visit Diagnosis: Pain in right shoulder  Abnormal posture  Stiffness due to immobility  Muscle weakness (generalized)  Other symptoms and signs involving the musculoskeletal system     Problem List Patient Active Problem List   Diagnosis Date Noted  . Chest pain with high risk for cardiac etiology 01/01/2015  .  HYPERCHOLESTEROLEMIA 03/09/2009  . BRONCHITIS, RECURRENT 03/09/2009  . EMPHYSEMA 03/09/2009  . SLEEP APNEA 03/09/2009  . COUGH 03/09/2009    Dyshon Philbin [name] PT, MPH  06/11/2016, 10:05 AM  [facility] [place], [place], [address] Phone: [phone]   Fax:  [phone]  Name: Kelli Rojas MRN: [id number] Date of Birth: [birth year]

## 2016-06-11 NOTE — Patient Instructions (Addendum)
ROM: Extension - Wand (Standing)    Stand holding wand behind back. Raise arms as far as possible. Repeat _10___ times per set. Do _1-2___ sets per session. Do _1___ sessions per day.    ROM: Towel Stretch - with Interior Rotation    Pull left arm up behind back by pulling towel up with other arm. Hold __10-20__ seconds. Repeat _5___ times per set. Do __1__ sets per session. Do __1-2__ sessions per day.  Shoulder rolls  Backward -

## 2016-06-12 ENCOUNTER — Encounter: Payer: Self-pay | Admitting: Physical Therapy

## 2016-06-12 ENCOUNTER — Ambulatory Visit (INDEPENDENT_AMBULATORY_CARE_PROVIDER_SITE_OTHER): Payer: 59 | Admitting: Physical Therapy

## 2016-06-12 DIAGNOSIS — M25511 Pain in right shoulder: Secondary | ICD-10-CM

## 2016-06-12 DIAGNOSIS — R293 Abnormal posture: Secondary | ICD-10-CM | POA: Diagnosis not present

## 2016-06-12 DIAGNOSIS — Z7409 Other reduced mobility: Secondary | ICD-10-CM

## 2016-06-12 DIAGNOSIS — M2560 Stiffness of unspecified joint, not elsewhere classified: Secondary | ICD-10-CM

## 2016-06-12 DIAGNOSIS — M6281 Muscle weakness (generalized): Secondary | ICD-10-CM

## 2016-06-12 DIAGNOSIS — M623 Immobility syndrome (paraplegic): Secondary | ICD-10-CM | POA: Diagnosis not present

## 2016-06-12 NOTE — Therapy (Signed)
[place], [place], [address] Phone: [phone]   Fax:  [phone]  Physical Therapy Treatment  Patient Details  Name: Kelli Rojas MRN: [id number] Date of Birth: 1956-06-12 Referring Provider: Dr Sydnee Cabal  Encounter Date: 06/12/2016      PT End of Session - 06/12/16 0804    Visit Number 5   Number of Visits 18   Date for PT Re-Evaluation 07/18/16   PT Start Time 0800   PT Stop Time 0902   PT Time Calculation (min) 62 min   Activity Tolerance Patient tolerated treatment well  pain with shoulder stretching as expected      Past Medical History:  Diagnosis Date  . Anxiety   . Depression   . Diabetes mellitus without complication ([place])   . Diabetic peripheral neuropathy ([place])   . Parkinson's disease ([place])   . Thyroid disease     Past Surgical History:  Procedure Laterality Date  . LEFT HEART CATHETERIZATION WITH CORONARY ANGIOGRAM N/A 01/02/2015   Procedure: LEFT HEART CATHETERIZATION WITH CORONARY ANGIOGRAM;  Surgeon: Adrian Prows, MD;  Location: [facility] CATH LAB;  Service: Cardiovascular;  Laterality: N/A;  . LUNG CANCER SURGERY      There were no vitals filed for this visit.      Subjective Assessment - 06/12/16 0802    Subjective Pt reports she is doing ok, did some of her stretches last night.    Currently in Pain? Yes   Pain Score 3    Pain Location Shoulder   Pain Orientation Right   Pain Descriptors / Indicators Dull   Pain Type Surgical pain                         [facility] Adult PT Treatment/Exercise - 06/12/16 0001      Shoulder Exercises: Supine   Flexion AAROM  overhead stretch with strap around wrists   Other Supine Exercises hands behind the head with shoulder presses     Shoulder Exercises: Prone   Retraction Strengthening;Right  3x10 with arm dangling off bed   Extension Strengthening;Right  with scap retraction 3x10     Shoulder  Exercises: Pulleys   Flexion --  10x10sec   ABduction --  10x10sec     Shoulder Exercises: ROM/Strengthening   UBE (Upper Arm Bike) L1 using Lt UE to move - Rt UE ROM x 4 min alt fwd/back      Modalities   Modalities Vasopneumatic;Electrical Stimulation     Electrical Stimulation   Electrical Stimulation Location Rt shoulder   Electrical Stimulation Action IFC   Electrical Stimulation Parameters  to tolerance   Electrical Stimulation Goals Pain;Tone     Vasopneumatic   Number Minutes Vasopneumatic  15 minutes   Vasopnuematic Location  Shoulder  Rt   Vasopneumatic Pressure Low   Vasopneumatic Temperature  3*     Manual Therapy   Manual therapy comments pt supine    Joint Mobilization GH mobs grade II   Soft tissue mobilization Rt deltoid   Passive ROM ER/IR/abduction                PT Education - 06/11/16 0853    Education provided Yes   Education Details HEP    Person(s) Educated Patient   Methods Explanation;Demonstration;Tactile cues;Verbal cues;Handout   Comprehension Verbalized understanding;Returned demonstration;Verbal cues required;Tactile cues required             PT Long Term  Goals - 06/09/16 0720      PT LONG TERM GOAL #1   Title Improve posture and alignment with pt to demonstrate upright posture and head and neck over body and shoulder blades down and back 07/18/16   Time 6   Period Weeks   Status On-going     PT LONG TERM GOAL #2   Title Improve Rt shoulder AROM to equal or greater than Lt 07/18/16   Time 6   Period Weeks   Status On-going     PT LONG TERM GOAL #3   Title Increase Rt UE strength to 4/5 to 5-/5   07/18/16   Time 6   Period Weeks   Status On-going     PT LONG TERM GOAL #4   Title Patient I in HEP 07/18/16   Time 6   Period Weeks   Status On-going     PT LONG TERM GOAL #5   Title Improve FOTO to </= 45% limitation 07/18/16   Time 6   Period Weeks   Status On-going               Plan - 06/12/16  0906    Clinical Impression Statement Kelli Rojas has improved shoulder rotation.  Limited in abduction due to pain in the acromion area.  Most likely due to RTC weakness and the humerous riding up.     Rehab Potential Good   PT Frequency Other (comment)   PT Duration 6 weeks   PT Treatment/Interventions Patient/family education;ADLs/Self Care Home Management;Neuromuscular re-education;Cryotherapy;Electrical Stimulation;Iontophoresis '4mg'$ /ml Dexamethasone;Moist Heat;Ultrasound;Manual techniques;Dry needling;Therapeutic activities;Therapeutic exercise   PT Next Visit Plan progress AAROM, scap stability   Consulted and Agree with Plan of Care Patient      Patient will benefit from skilled therapeutic intervention in order to improve the following deficits and impairments:  Postural dysfunction, Improper body mechanics, Pain, Decreased range of motion, Decreased mobility, Decreased strength, Impaired UE functional use, Decreased activity tolerance  Visit Diagnosis: Pain in right shoulder  Abnormal posture  Stiffness due to immobility  Muscle weakness (generalized)     Problem List Patient Active Problem List   Diagnosis Date Noted  . Chest pain with high risk for cardiac etiology 01/01/2015  . HYPERCHOLESTEROLEMIA 03/09/2009  . BRONCHITIS, RECURRENT 03/09/2009  . EMPHYSEMA 03/09/2009  . SLEEP APNEA 03/09/2009  . COUGH 03/09/2009    [name] PT  06/12/2016, 9:09 AM  [facility] [place], [place], [address] Phone: [phone]   Fax:  [phone]  Name: Kelli Rojas MRN: [id number] Date of Birth: Apr 19, 1956

## 2016-06-13 ENCOUNTER — Ambulatory Visit (INDEPENDENT_AMBULATORY_CARE_PROVIDER_SITE_OTHER): Payer: 59 | Admitting: Rehabilitative and Restorative Service Providers"

## 2016-06-13 ENCOUNTER — Encounter: Payer: Self-pay | Admitting: Rehabilitative and Restorative Service Providers"

## 2016-06-13 DIAGNOSIS — M6281 Muscle weakness (generalized): Secondary | ICD-10-CM

## 2016-06-13 DIAGNOSIS — R293 Abnormal posture: Secondary | ICD-10-CM

## 2016-06-13 DIAGNOSIS — M25511 Pain in right shoulder: Secondary | ICD-10-CM

## 2016-06-13 DIAGNOSIS — M623 Immobility syndrome (paraplegic): Secondary | ICD-10-CM | POA: Diagnosis not present

## 2016-06-13 DIAGNOSIS — R29898 Other symptoms and signs involving the musculoskeletal system: Secondary | ICD-10-CM

## 2016-06-13 DIAGNOSIS — Z7409 Other reduced mobility: Secondary | ICD-10-CM

## 2016-06-13 DIAGNOSIS — M2560 Stiffness of unspecified joint, not elsewhere classified: Secondary | ICD-10-CM

## 2016-06-13 NOTE — Therapy (Signed)
[place], [place], [address] Phone: [phone]   Fax:  [phone]  Physical Therapy Treatment  Patient Details  Name: Kelli Rojas MRN: [id number] Date of Birth: May 19, 1956 Referring Provider: Dr Sydnee Cabal  Encounter Date: 06/13/2016      PT End of Session - 06/13/16 0805    Visit Number 6   Number of Visits 18   Date for PT Re-Evaluation 07/18/16   PT Start Time 0801   PT Stop Time 0859   PT Time Calculation (min) 58 min   Activity Tolerance Patient tolerated treatment well      Past Medical History:  Diagnosis Date  . Anxiety   . Depression   . Diabetes mellitus without complication ([place])   . Diabetic peripheral neuropathy ([place])   . Parkinson's disease ([place])   . Thyroid disease     Past Surgical History:  Procedure Laterality Date  . LEFT HEART CATHETERIZATION WITH CORONARY ANGIOGRAM N/A 01/02/2015   Procedure: LEFT HEART CATHETERIZATION WITH CORONARY ANGIOGRAM;  Surgeon: Adrian Prows, MD;  Location: [facility] CATH LAB;  Service: Cardiovascular;  Laterality: N/A;  . LUNG CANCER SURGERY      There were no vitals filed for this visit.      Subjective Assessment - 06/13/16 0806    Subjective Yaileen reports that she is working on her exercises at home and she can tell she is improving. She has less pain and can tell she can stretch shoulder more.Some increased soreness from exercises.    Currently in Pain? Yes   Pain Score 4    Pain Location Shoulder   Pain Orientation Right   Pain Descriptors / Indicators Aching;Dull   Pain Type Surgical pain   Pain Onset 1 to 4 weeks ago   Pain Frequency Intermittent            [facility] PT Assessment - 06/13/16 0001      Assessment   Medical Diagnosis Rt shoulder DCR/SAD    Referring Provider Dr Sydnee Cabal   Onset Date/Surgical Date 06/06/16   Hand Dominance Right   Next MD Visit 06/13/16   Prior Therapy yes      AROM   Right  Shoulder Extension 43 Degrees   Right Shoulder Flexion 110 Degrees   Right Shoulder ABduction 89 Degrees   Right Shoulder Internal Rotation 70 Degrees   Right Shoulder External Rotation 28 Degrees   Left Shoulder Extension 45 Degrees   Left Shoulder Flexion 141 Degrees   Left Shoulder ABduction 152 Degrees   Left Shoulder Internal Rotation 33 Degrees   Left Shoulder External Rotation 84 Degrees     PROM   Right Shoulder Flexion 134 Degrees   Right Shoulder ABduction 128 Degrees   Right Shoulder Internal Rotation 58 Degrees   Right Shoulder External Rotation 81 Degrees                     [facility] Adult PT Treatment/Exercise - 06/13/16 0001      Shoulder Exercises: Standing   External Rotation Strengthening;Both;20 reps   Theraband Level (Shoulder External Rotation) Level 1 (Yellow)   Extension Strengthening;Both;20 reps   Theraband Level (Shoulder Extension) Level 1 (Yellow)   Row Strengthening;Both;20 reps   Theraband Level (Shoulder Row) Level 1 (Yellow)   Retraction AROM;Both;10 reps   Other Standing Exercises scapular depression red TB x 20    Other Standing Exercises shoulder rolls x 10-20      Shoulder  Exercises: Pulleys   Flexion --  10x10sec   ABduction --  10x10sec     Shoulder Exercises: ROM/Strengthening   UBE (Upper Arm Bike) L1 using Lt UE to move - Rt UE ROM x 4 min alt fwd/back      Shoulder Exercises: Isometric Strengthening   ABduction 5X5"     Electrical Stimulation   Electrical Stimulation Location Rt shoulder   Electrical Stimulation Action IFC   Electrical Stimulation Parameters to tolerance   Electrical Stimulation Goals Pain;Tone     Vasopneumatic   Number Minutes Vasopneumatic  15 minutes   Vasopnuematic Location  Shoulder  Rt   Vasopneumatic Pressure Low   Vasopneumatic Temperature  3*     Manual Therapy   Manual therapy comments pt supine    Joint Mobilization GH mobs grade II   Soft tissue mobilization Rt deltoid    Passive ROM flex/ER/IR/abduction                PT Education - 06/13/16 0829    Education provided Yes   Education Details HEP   Person(s) Educated Patient   Methods Explanation;Demonstration;Tactile cues;Verbal cues;Handout   Comprehension Verbalized understanding;Returned demonstration;Verbal cues required;Tactile cues required             PT Long Term Goals - 06/13/16 0805      PT LONG TERM GOAL #1   Title Improve posture and alignment with pt to demonstrate upright posture and head and neck over body and shoulder blades down and back 07/18/16   Time 6   Period Weeks   Status On-going     PT LONG TERM GOAL #2   Title Improve Rt shoulder AROM to equal or greater than Lt 07/18/16   Time 6   Period Weeks   Status On-going     PT LONG TERM GOAL #3   Title Increase Rt UE strength to 4/5 to 5-/5   07/18/16   Time 6   Period Weeks   Status On-going     PT LONG TERM GOAL #4   Title Patient I in HEP 07/18/16   Time 6   Period Weeks   Status On-going     PT LONG TERM GOAL #5   Title Improve FOTO to </= 45% limitation 07/18/16   Time 6   Period Weeks   Status On-going               Plan - 06/13/16 5188    Clinical Impression Statement Gradual improvement in shoulder mobility and ROM. Patient has continued pain but intensity and frequency have decreased. Sherilynn is progressing well with shoulder rehab.    Rehab Potential Good   PT Frequency 3x / week   PT Duration 6 weeks   PT Treatment/Interventions Patient/family education;ADLs/Self Care Home Management;Neuromuscular re-education;Cryotherapy;Electrical Stimulation;Iontophoresis '4mg'$ /ml Dexamethasone;Moist Heat;Ultrasound;Manual techniques;Dry needling;Therapeutic activities;Therapeutic exercise   PT Next Visit Plan progress AAROM, scap stability   Consulted and Agree with Plan of Care Patient      Patient will benefit from skilled therapeutic intervention in order to improve the following deficits  and impairments:  Postural dysfunction, Improper body mechanics, Pain, Decreased range of motion, Decreased mobility, Decreased strength, Impaired UE functional use, Decreased activity tolerance  Visit Diagnosis: Pain in right shoulder  Abnormal posture  Stiffness due to immobility  Muscle weakness (generalized)  Other symptoms and signs involving the musculoskeletal system     Problem List Patient Active Problem List   Diagnosis Date Noted  . Chest pain with  high risk for cardiac etiology 01/01/2015  . HYPERCHOLESTEROLEMIA 03/09/2009  . BRONCHITIS, RECURRENT 03/09/2009  . EMPHYSEMA 03/09/2009  . SLEEP APNEA 03/09/2009  . COUGH 03/09/2009    Ranya Fiddler [name] PT,  MPH  06/13/2016, 8:44 AM  [facility] [place], [place], [address] Phone: [phone]   Fax:  [phone]  Name: CELESTA FUNDERBURK MRN: [id number] Date of Birth: 1956/03/23

## 2016-06-13 NOTE — Patient Instructions (Addendum)
Lift chest, arms at side, reach toward the floor. Hold 3-5 sec x 10 2 sets 2-3 times/day   Resisted External Rotation: in Neutral - Bilateral   PALMS UP Sit or stand, tubing in both hands, elbows at sides, bent to 90, forearms forward. Pinch shoulder blades together and rotate forearms out. Keep elbows at sides. Repeat __10__ times per set. Do _2-3___ sets per session. Do _2-3___ sessions per day.   Low Row: Standing   Face anchor, feet shoulder width apart. Palms up, pull arms back, squeezing shoulder blades together. Repeat 10__ times per set. Do 2-3__ sets per session. Do 2-3__ sessions per week. Anchor Height: Waist     Strengthening: Resisted Extension   Hold tubing in right hand, arm forward. Pull arm back, elbow straight. Repeat _10___ times per set. Do 2-3____ sets per session. Do 2-3____ sessions per day.  Strengthening: Isometric Abduction    Using wall for resistance, press left arm into ball using light pressure. Hold __5__ seconds. Repeat __10__ times per set. Do __1-3__ sets per session. Do _1-2__ sessions per day.

## 2016-06-17 ENCOUNTER — Ambulatory Visit (INDEPENDENT_AMBULATORY_CARE_PROVIDER_SITE_OTHER): Payer: 59 | Admitting: Rehabilitative and Restorative Service Providers"

## 2016-06-17 ENCOUNTER — Encounter: Payer: Self-pay | Admitting: Rehabilitative and Restorative Service Providers"

## 2016-06-17 DIAGNOSIS — M623 Immobility syndrome (paraplegic): Secondary | ICD-10-CM

## 2016-06-17 DIAGNOSIS — M25511 Pain in right shoulder: Secondary | ICD-10-CM | POA: Diagnosis not present

## 2016-06-17 DIAGNOSIS — Z7409 Other reduced mobility: Secondary | ICD-10-CM

## 2016-06-17 DIAGNOSIS — M2560 Stiffness of unspecified joint, not elsewhere classified: Secondary | ICD-10-CM

## 2016-06-17 DIAGNOSIS — M6281 Muscle weakness (generalized): Secondary | ICD-10-CM

## 2016-06-17 DIAGNOSIS — R293 Abnormal posture: Secondary | ICD-10-CM

## 2016-06-17 DIAGNOSIS — R29898 Other symptoms and signs involving the musculoskeletal system: Secondary | ICD-10-CM

## 2016-06-17 NOTE — Therapy (Signed)
[place], [place], [address] Phone: [phone]   Fax:  [phone]  Physical Therapy Treatment  Patient Details  Name: Kelli Rojas MRN: [id number] Date of Birth: 11-22-1955 Referring Provider: Dr Sydnee Cabal  Encounter Date: 06/17/2016      PT End of Session - 06/17/16 0802    Visit Number 7   Number of Visits 18   Date for PT Re-Evaluation 07/18/16   PT Start Time 0800   PT Stop Time 0856   PT Time Calculation (min) 56 min   Activity Tolerance Patient tolerated treatment well      Past Medical History:  Diagnosis Date  . Anxiety   . Depression   . Diabetes mellitus without complication ([place])   . Diabetic peripheral neuropathy ([place])   . Parkinson's disease ([place])   . Thyroid disease     Past Surgical History:  Procedure Laterality Date  . LEFT HEART CATHETERIZATION WITH CORONARY ANGIOGRAM N/A 01/02/2015   Procedure: LEFT HEART CATHETERIZATION WITH CORONARY ANGIOGRAM;  Surgeon: Adrian Prows, MD;  Location: [facility] CATH LAB;  Service: Cardiovascular;  Laterality: N/A;  . LUNG CANCER SURGERY      There were no vitals filed for this visit.      Subjective Assessment - 06/17/16 0802    Subjective Shoulder still hurts - made it till 3:30 yesterday at work. Had to go home and take a pain pill. Saw the doctor and he told her he did repair a rotator cuff tendon when he did surgery. Pleased with her progress.    Currently in Pain? Yes   Pain Score 4    Pain Location Shoulder   Pain Orientation Right   Pain Descriptors / Indicators Aching;Dull;Burning   Pain Type Surgical pain   Pain Onset 1 to 4 weeks ago   Pain Frequency Intermittent                         [facility] Adult PT Treatment/Exercise - 06/17/16 0001      Shoulder Exercises: Standing   Other Standing Exercises scapular depression red TB x 20    Other Standing Exercises rings over and back x 1 (counter height)       Shoulder Exercises: Pulleys   Flexion --  10x10sec   ABduction --  10x10sec     Shoulder Exercises: ROM/Strengthening   UBE (Upper Arm Bike) L2 4.5 min alt fwd/back     Shoulder Exercises: Stretch   Corner Stretch 60 seconds  3 way doorway stretch 30 sec x 3    Wall Stretch - Flexion 5 reps  wall ladder    Table Stretch - Flexion 5 reps;20 seconds   Other Shoulder Stretches pendulum 30CW; 30 CCW   Other Shoulder Stretches AAROM shd flex supine x 3      Electrical Stimulation   Electrical Stimulation Location Rt shoulder   Electrical Stimulation Action IFC   Electrical Stimulation Parameters to tolerance   Electrical Stimulation Goals Pain;Tone     Vasopneumatic   Number Minutes Vasopneumatic  15 minutes   Vasopnuematic Location  Shoulder  Rt   Vasopneumatic Pressure Low   Vasopneumatic Temperature  3*     Manual Therapy   Manual therapy comments pt supine    Joint Mobilization GH mobs grade II   Soft tissue mobilization ; pecs; teres    Passive ROM flex/ER/IR/abduction  PT Education - 06/17/16 0845    Education provided Yes   Education Details HEP stressed the importance of stretching    Person(s) Educated Patient   Methods Explanation;Demonstration;Tactile cues;Verbal cues;Handout   Comprehension Verbalized understanding;Returned demonstration;Verbal cues required;Tactile cues required             PT Long Term Goals - 06/17/16 0812      PT LONG TERM GOAL #1   Title Improve posture and alignment with pt to demonstrate upright posture and head and neck over body and shoulder blades down and back 07/18/16   Time 6   Period Weeks   Status On-going     PT LONG TERM GOAL #2   Title Improve Rt shoulder AROM to equal or greater than Lt 07/18/16   Time 6   Period Weeks   Status On-going     PT LONG TERM GOAL #3   Title Increase Rt UE strength to 4/5 to 5-/5   07/18/16   Time 6   Period Weeks   Status On-going     PT LONG TERM  GOAL #4   Title Patient I in HEP 07/18/16   Time 6   Period Weeks   Status On-going     PT LONG TERM GOAL #5   Title Improve FOTO to </= 45% limitation 07/18/16   Time 6   Period Weeks   Status On-going               Plan - 06/17/16 0811    Clinical Impression Statement Working on ROM/stretching with gradual improvement. Working on gentle strengthening. Gaining ROM and progressing toward stated goals of therapy.    Rehab Potential Good   PT Frequency 2x / week   PT Duration 6 weeks   PT Treatment/Interventions Patient/family education;ADLs/Self Care Home Management;Neuromuscular re-education;Cryotherapy;Electrical Stimulation;Iontophoresis '4mg'$ /ml Dexamethasone;Moist Heat;Ultrasound;Manual techniques;Dry needling;Therapeutic activities;Therapeutic exercise   PT Next Visit Plan progress AAROM, scap stability; working on [employer] and Agree with Plan of Care Patient      Patient will benefit from skilled therapeutic intervention in order to improve the following deficits and impairments:  Postural dysfunction, Improper body mechanics, Pain, Decreased range of motion, Decreased mobility, Decreased strength, Impaired UE functional use, Decreased activity tolerance  Visit Diagnosis: Pain in right shoulder  Abnormal posture  Stiffness due to immobility  Muscle weakness (generalized)  Other symptoms and signs involving the musculoskeletal system     Problem List Patient Active Problem List   Diagnosis Date Noted  . Chest pain with high risk for cardiac etiology 01/01/2015  . HYPERCHOLESTEROLEMIA 03/09/2009  . BRONCHITIS, RECURRENT 03/09/2009  . EMPHYSEMA 03/09/2009  . SLEEP APNEA 03/09/2009  . COUGH 03/09/2009    Celyn [name] PT, MPH  06/17/2016, 8:48 AM  [facility] [place], [place], [address] Phone: [phone]   Fax:  [phone]  Name: Kelli Rojas MRN:  [id number] Date of Birth: [birth year]

## 2016-06-17 NOTE — Patient Instructions (Addendum)
External Rotator Cuff Stretch, Supine    Lie supine, fingers clasped behind head, elbows close together. Pull elbows backward while pinching shoulder blades. Hold __30-60_ seconds. Repeat __3-5_ times per session. Do _2-3__ sessions per day.  Scapula Adduction With Pectoralis Stretch: Low - Standing   Shoulders at 45 hands even with shoulders, keeping weight through legs, shift weight forward until you feel pull or stretch through the front of your chest. Hold _30__ seconds. Do _3__ times, _2-4__ times per day.   Scapula Adduction With Pectoralis Stretch: Mid-Range - Standing   Shoulders at 90 elbows even with shoulders, keeping weight through legs, shift weight forward until you feel pull or strength through the front of your chest. Hold __30_ seconds. Do _3__ times, __2-4_ times per day.   Scapula Adduction With Pectoralis Stretch: High - Standing   Shoulders at 120 hands up high on the doorway, keeping weight on feet, shift weight forward until you feel pull or stretch through the front of your chest. Hold _30__ seconds. Do _3__ times, _2-3__ times per day.

## 2016-06-18 ENCOUNTER — Encounter: Payer: 59 | Admitting: Physical Therapy

## 2016-06-20 ENCOUNTER — Encounter: Payer: Self-pay | Admitting: Rehabilitative and Restorative Service Providers"

## 2016-06-20 ENCOUNTER — Ambulatory Visit (INDEPENDENT_AMBULATORY_CARE_PROVIDER_SITE_OTHER): Payer: 59 | Admitting: Rehabilitative and Restorative Service Providers"

## 2016-06-20 DIAGNOSIS — M25511 Pain in right shoulder: Secondary | ICD-10-CM | POA: Diagnosis not present

## 2016-06-20 DIAGNOSIS — R293 Abnormal posture: Secondary | ICD-10-CM | POA: Diagnosis not present

## 2016-06-20 DIAGNOSIS — M6281 Muscle weakness (generalized): Secondary | ICD-10-CM | POA: Diagnosis not present

## 2016-06-20 DIAGNOSIS — M623 Immobility syndrome (paraplegic): Secondary | ICD-10-CM | POA: Diagnosis not present

## 2016-06-20 DIAGNOSIS — Z7409 Other reduced mobility: Secondary | ICD-10-CM

## 2016-06-20 DIAGNOSIS — M2560 Stiffness of unspecified joint, not elsewhere classified: Secondary | ICD-10-CM

## 2016-06-20 DIAGNOSIS — R29898 Other symptoms and signs involving the musculoskeletal system: Secondary | ICD-10-CM

## 2016-06-20 NOTE — Therapy (Signed)
[place], [place], [address] Phone: [phone]   Fax:  [phone]  Physical Therapy Treatment  Patient Details  Name: Kelli Rojas MRN: [id number] Date of Birth: 16-Sep-1956 Referring Provider: Dr Sydnee Cabal  Encounter Date: 06/20/2016      PT End of Session - 06/20/16 0805    Visit Number 8   Number of Visits 18   Date for PT Re-Evaluation 07/18/16   PT Start Time 0800   PT Stop Time 6948   PT Time Calculation (min) 57 min   Activity Tolerance Patient tolerated treatment well      Past Medical History:  Diagnosis Date  . Anxiety   . Depression   . Diabetes mellitus without complication ([place])   . Diabetic peripheral neuropathy ([place])   . Parkinson's disease ([place])   . Thyroid disease     Past Surgical History:  Procedure Laterality Date  . LEFT HEART CATHETERIZATION WITH CORONARY ANGIOGRAM N/A 01/02/2015   Procedure: LEFT HEART CATHETERIZATION WITH CORONARY ANGIOGRAM;  Surgeon: Adrian Prows, MD;  Location: [facility] CATH LAB;  Service: Cardiovascular;  Laterality: N/A;  . LUNG CANCER SURGERY      There were no vitals filed for this visit.      Subjective Assessment - 06/20/16 0805    Subjective Trying to work on exercises more - working on them some at work as well as at home    Currently in Pain? Yes   Pain Score 3    Pain Location Shoulder   Pain Orientation Right   Pain Descriptors / Indicators Aching;Dull   Pain Type Surgical pain;Chronic pain   Pain Onset More than a month ago   Pain Frequency Intermittent                         [facility] Adult PT Treatment/Exercise - 06/20/16 0001      Shoulder Exercises: Sidelying   External Rotation Strengthening;Right;20 reps   External Rotation Weight (lbs) 1   ABduction Strengthening;Right;20 reps     Shoulder Exercises: Standing   Other Standing Exercises scaption with scap squeeze at noodle 1# ~80 deg 10x2sets      Shoulder Exercises: Pulleys   Flexion --  10x10sec   ABduction --  10x10sec     Shoulder Exercises: Therapy Ball   Flexion 5 reps  stepping under ball standing at wall   Other Therapy Ball Exercises scapular stabilization small ball on wall 30 CW/CCW/diagonal      Shoulder Exercises: ROM/Strengthening   UBE (Upper Arm Bike) L2 4 min alt fwd/back     Shoulder Exercises: Stretch   Internal Rotation Stretch 5 reps   Wall Stretch - Flexion 5 reps  wall ladder    Other Shoulder Stretches AAROM shd flex supine x 3      Cryotherapy   Number Minutes Cryotherapy 15 Minutes   Cryotherapy Location Shoulder  Rt   Type of Cryotherapy Ice pack     Electrical Stimulation   Electrical Stimulation Location Rt shoulder   Electrical Stimulation Action IFC   Electrical Stimulation Parameters to tolerance   Electrical Stimulation Goals Pain;Tone     Manual Therapy   Manual therapy comments pt supine    Joint Mobilization [place] mobs grade II   Soft tissue mobilization pecs; teres    Passive ROM flex/ER/IR/abduction                PT Education - 06/20/16  0835    Education provided Yes   Education Details HEP    Person(s) Educated Patient   Methods Explanation;Demonstration;Tactile cues;Verbal cues;Handout   Comprehension Verbalized understanding;Returned demonstration;Verbal cues required;Tactile cues required             PT Long Term Goals - 06/17/16 0812      PT LONG TERM GOAL #1   Title Improve posture and alignment with pt to demonstrate upright posture and head and neck over body and shoulder blades down and back 07/18/16   Time 6   Period Weeks   Status On-going     PT LONG TERM GOAL #2   Title Improve Rt shoulder AROM to equal or greater than Lt 07/18/16   Time 6   Period Weeks   Status On-going     PT LONG TERM GOAL #3   Title Increase Rt UE strength to 4/5 to 5-/5   07/18/16   Time 6   Period Weeks   Status On-going     PT LONG TERM GOAL #4   Title  Patient I in HEP 07/18/16   Time 6   Period Weeks   Status On-going     PT LONG TERM GOAL #5   Title Improve FOTO to </= 45% limitation 07/18/16   Time 6   Period Weeks   Status On-going               Plan - 06/20/16 0721    Clinical Impression Statement Working more consistently at home with improved active movement. Working on scapular control and stabilization. Progressing exercise program.    Rehab Potential Good   PT Frequency 2x / week   PT Duration 6 weeks   PT Treatment/Interventions Patient/family education;ADLs/Self Care Home Management;Neuromuscular re-education;Cryotherapy;Electrical Stimulation;Iontophoresis '4mg'$ /ml Dexamethasone;Moist Heat;Ultrasound;Manual techniques;Dry needling;Therapeutic activities;Therapeutic exercise   PT Next Visit Plan progress AAROM, scap stability; working on [employer] and Agree with Plan of Care Patient      Patient will benefit from skilled therapeutic intervention in order to improve the following deficits and impairments:  Postural dysfunction, Improper body mechanics, Pain, Decreased range of motion, Decreased mobility, Decreased strength, Impaired UE functional use, Decreased activity tolerance  Visit Diagnosis: Pain in right shoulder  Abnormal posture  Stiffness due to immobility  Muscle weakness (generalized)  Other symptoms and signs involving the musculoskeletal system     Problem List Patient Active Problem List   Diagnosis Date Noted  . Chest pain with high risk for cardiac etiology 01/01/2015  . HYPERCHOLESTEROLEMIA 03/09/2009  . BRONCHITIS, RECURRENT 03/09/2009  . EMPHYSEMA 03/09/2009  . SLEEP APNEA 03/09/2009  . COUGH 03/09/2009    Kelli Rojas [name] PT, MPH 06/20/2016, 8:52 AM  [facility] [place], [place], [address] Phone: [phone]   Fax:  [phone]  Name: Kelli Rojas MRN: [id number] Date of Birth:  1956/08/02

## 2016-06-20 NOTE — Patient Instructions (Addendum)
Progressive Resisted: Abduction (Standing)    Holding _1-2___ pound weights, raise arms out from sides. Repeat 10____ times per set. Do __2-3__ sets per session. Do _1___ sessions per day.    Scapular Retraction: Elbow Flexion (Standing)    With elbows bent to 90, pinch shoulder blades together and rotate arms out, keeping elbows bent. Repeat __10__ times per set. Do __1-2__ sets per session. Do __1-2__ sessions per day.    Progressive Resisted: External Rotation (Side-Lying)    Holding __1-2__ pound weight, towel under arm, raise right forearm toward ceiling. Keep elbow bent and at side. Repeat __10__ times per set. Do __2-3__ sets per session. Do _1___ sessions per day.  Ball on Wall: Circle - Standing   Step under the ball - stretching arm over head  Roll ball in circles. Do this actively with both hands on ball. Roll _10-20__ times, _1__ times per day.

## 2016-06-23 ENCOUNTER — Encounter: Payer: 59 | Admitting: Rehabilitative and Restorative Service Providers"

## 2016-06-23 ENCOUNTER — Encounter: Payer: 59 | Admitting: Physical Therapy

## 2016-06-25 ENCOUNTER — Encounter: Payer: 59 | Admitting: Physical Therapy

## 2016-06-25 ENCOUNTER — Encounter: Payer: Self-pay | Admitting: Rehabilitative and Restorative Service Providers"

## 2016-06-25 ENCOUNTER — Ambulatory Visit (INDEPENDENT_AMBULATORY_CARE_PROVIDER_SITE_OTHER): Payer: 59 | Admitting: Rehabilitative and Restorative Service Providers"

## 2016-06-25 DIAGNOSIS — M623 Immobility syndrome (paraplegic): Secondary | ICD-10-CM | POA: Diagnosis not present

## 2016-06-25 DIAGNOSIS — M25511 Pain in right shoulder: Secondary | ICD-10-CM

## 2016-06-25 DIAGNOSIS — R293 Abnormal posture: Secondary | ICD-10-CM | POA: Diagnosis not present

## 2016-06-25 DIAGNOSIS — M2560 Stiffness of unspecified joint, not elsewhere classified: Secondary | ICD-10-CM

## 2016-06-25 DIAGNOSIS — Z7409 Other reduced mobility: Secondary | ICD-10-CM

## 2016-06-25 DIAGNOSIS — M6281 Muscle weakness (generalized): Secondary | ICD-10-CM | POA: Diagnosis not present

## 2016-06-25 DIAGNOSIS — R29898 Other symptoms and signs involving the musculoskeletal system: Secondary | ICD-10-CM

## 2016-06-25 NOTE — Patient Instructions (Signed)
Row: Mid-Range - Standing    With yellow band anchored at chest level, pull elbows backward, squeezing shoulder blades together. Keep head and spine neutral. Row _10__ times,2-3 sets  _1__ times per day.

## 2016-06-25 NOTE — Therapy (Signed)
[place], [place], [address] Phone: [phone]   Fax:  [phone]  Physical Therapy Treatment  Patient Details  Name: Kelli Rojas MRN: [id number] Date of Birth: 1955-12-06 Referring Provider: Dr Nehemiah Settle  Encounter Date: 06/25/2016      PT End of Session - 06/25/16 0806    Visit Number 9   Number of Visits 18   Date for PT Re-Evaluation 07/18/16   PT Start Time 0758   PT Stop Time 0850   PT Time Calculation (min) 52 min   Activity Tolerance Patient tolerated treatment well      Past Medical History:  Diagnosis Date  . Anxiety   . Depression   . Diabetes mellitus without complication ([place])   . Diabetic peripheral neuropathy ([place])   . Parkinson's disease ([place])   . Thyroid disease     Past Surgical History:  Procedure Laterality Date  . LEFT HEART CATHETERIZATION WITH CORONARY ANGIOGRAM N/A 01/02/2015   Procedure: LEFT HEART CATHETERIZATION WITH CORONARY ANGIOGRAM;  Surgeon: Adrian Prows, MD;  Location: [facility] CATH LAB;  Service: Cardiovascular;  Laterality: N/A;  . LUNG CANCER SURGERY      There were no vitals filed for this visit.      Subjective Assessment - 06/25/16 0810    Subjective Working on exercises. Shoulder still hurts but she is sleeping without awakening due to pain.    Currently in Pain? Yes   Pain Score 2    Pain Location Shoulder   Pain Orientation Right   Pain Descriptors / Indicators Aching;Dull   Pain Type Surgical pain   Pain Onset More than a month ago   Pain Frequency Intermittent            [facility] PT Assessment - 06/25/16 0001      Assessment   Medical Diagnosis Rt shoulder DCR/SAD    Referring Provider Dr Nehemiah Settle   Onset Date/Surgical Date 06/06/16   Hand Dominance Right   Next MD Visit 07/18/16   Prior Therapy yes      AROM   Right Shoulder Extension 43 Degrees   Right Shoulder Flexion 134 Degrees   Right Shoulder ABduction 115  Degrees   Right Shoulder Internal Rotation 32 Degrees   Right Shoulder External Rotation 78 Degrees     PROM   Right Shoulder Flexion 147 Degrees                     [facility] Adult PT Treatment/Exercise - 06/25/16 0001      Shoulder Exercises: Standing   External Rotation Strengthening;Both;20 reps   Theraband Level (Shoulder External Rotation) Level 1 (Yellow)   ABduction Strengthening;Both;12 reps;Weights  2 sets   Shoulder ABduction Weight (lbs) 1#   Extension Strengthening;Both;20 reps   Theraband Level (Shoulder Extension) Level 3 (Green)   Row Strengthening;Both;20 reps   Theraband Level (Shoulder Row) Level 3 (Green)   Other Standing Exercises scaption with scap squeeze at noodle 1# ~80 deg 10x2sets      Shoulder Exercises: Pulleys   Flexion --  10x10sec   ABduction --  10x10sec     Shoulder Exercises: Therapy Ball   Flexion 5 reps  stepping under ball standing at wall   Other Therapy Ball Exercises scapular stabilization small ball on wall 30 CW/CCW/diagonal/ flex and ext/horizontal ab and add      Shoulder Exercises: ROM/Strengthening   UBE (Upper Arm Bike) L2 4 min alt fwd/back  Shoulder Exercises: Stretch   Corner Stretch 60 seconds  3 positions   Wall Stretch - Flexion 5 reps  wall ladder      Cryotherapy   Number Minutes Cryotherapy 15 Minutes   Cryotherapy Location Shoulder  Rt   Type of Cryotherapy Ice massage     Electrical Stimulation   Electrical Stimulation Location Rt shoulder   Electrical Stimulation Action IFC   Electrical Stimulation Parameters to toleranc   Electrical Stimulation Goals Pain;Tone     Manual Therapy   Manual therapy comments pt supine    Joint Mobilization [place] mobs grade II   Soft tissue mobilization pecs; teres    Passive ROM flex/ER/IR/abduction                PT Education - 06/25/16 0844    Education provided Yes   Education Details HEP    Person(s) Educated Patient   Methods  Explanation;Demonstration;Tactile cues;Verbal cues;Handout   Comprehension Verbalized understanding;Returned demonstration;Verbal cues required;Tactile cues required             PT Long Term Goals - 06/25/16 0807      PT LONG TERM GOAL #1   Title Improve posture and alignment with pt to demonstrate upright posture and head and neck over body and shoulder blades down and back 07/18/16   Time 6   Period Weeks   Status Partially Met     PT LONG TERM GOAL #2   Title Improve Rt shoulder AROM to equal or greater than Lt 07/18/16   Time 6   Period Weeks   Status On-going     PT LONG TERM GOAL #3   Time 6   Period Weeks   Status On-going     PT LONG TERM GOAL #4   Title Patient I in HEP 07/18/16   Time 6   Period Weeks   Status On-going     PT LONG TERM GOAL #5   Title Improve FOTO to </= 45% limitation 07/18/16   Time 6   Period Weeks   Status On-going               Plan - 06/25/16 0809    Clinical Impression Statement Gradually progressing with ROM and function. Continues to have tightness and discomfort/pain. Sleeping without awakening due to pain.   Rehab Potential Good   PT Frequency 2x / week   PT Duration 6 weeks   PT Treatment/Interventions Patient/family education;ADLs/Self Care Home Management;Neuromuscular re-education;Cryotherapy;Electrical Stimulation;Iontophoresis 38m/ml Dexamethasone;Moist Heat;Ultrasound;Manual techniques;Dry needling;Therapeutic activities;Therapeutic exercise   PT Next Visit Plan progress AAROM, scap stability; working on [employer] Agree with Plan of Care Patient      Patient will benefit from skilled therapeutic intervention in order to improve the following deficits and impairments:  Postural dysfunction, Improper body mechanics, Pain, Decreased range of motion, Decreased mobility, Decreased strength, Impaired UE functional use, Decreased activity tolerance  Visit Diagnosis: Pain in right  shoulder  Abnormal posture  Stiffness due to immobility  Muscle weakness (generalized)  Other symptoms and signs involving the musculoskeletal system     Problem List Patient Active Problem List   Diagnosis Date Noted  . Chest pain with high risk for cardiac etiology 01/01/2015  . HYPERCHOLESTEROLEMIA 03/09/2009  . BRONCHITIS, RECURRENT 03/09/2009  . EMPHYSEMA 03/09/2009  . SLEEP APNEA 03/09/2009  . COUGH 03/09/2009    [name] PNilda SimmerPT, MPH  06/25/2016, 8:45 AM  [facility] [place] [address]  [place], [place], [address] Phone: [phone]   Fax:  [phone]  Name: Kelli Rojas MRN: [id number] Date of Birth: 1955/10/10

## 2016-06-27 ENCOUNTER — Encounter: Payer: 59 | Admitting: Rehabilitative and Restorative Service Providers"

## 2016-07-02 ENCOUNTER — Ambulatory Visit (INDEPENDENT_AMBULATORY_CARE_PROVIDER_SITE_OTHER): Payer: 59 | Admitting: Rehabilitative and Restorative Service Providers"

## 2016-07-02 ENCOUNTER — Encounter: Payer: Self-pay | Admitting: Rehabilitative and Restorative Service Providers"

## 2016-07-02 DIAGNOSIS — G8929 Other chronic pain: Secondary | ICD-10-CM

## 2016-07-02 DIAGNOSIS — M6281 Muscle weakness (generalized): Secondary | ICD-10-CM

## 2016-07-02 DIAGNOSIS — M623 Immobility syndrome (paraplegic): Secondary | ICD-10-CM

## 2016-07-02 DIAGNOSIS — R293 Abnormal posture: Secondary | ICD-10-CM

## 2016-07-02 DIAGNOSIS — M2560 Stiffness of unspecified joint, not elsewhere classified: Secondary | ICD-10-CM

## 2016-07-02 DIAGNOSIS — R29898 Other symptoms and signs involving the musculoskeletal system: Secondary | ICD-10-CM

## 2016-07-02 DIAGNOSIS — M25511 Pain in right shoulder: Secondary | ICD-10-CM

## 2016-07-02 DIAGNOSIS — Z7409 Other reduced mobility: Secondary | ICD-10-CM

## 2016-07-02 NOTE — Therapy (Signed)
[facility] [place], [place], [address] Phone: [phone]   Fax:  [phone]  Physical Therapy Treatment  Patient Details  Name: Kelli Rojas MRN: [id number] Date of Birth: [birth year] Referring Provider: Dr Sydnee Cabal  Encounter Date: 07/02/2016      PT End of Session - 07/02/16 1601    Visit Number 10   Number of Visits 18   Date for PT Re-Evaluation 07/18/16   PT Start Time 1600   PT Stop Time 1654   PT Time Calculation (min) 54 min   Activity Tolerance Patient tolerated treatment well      Past Medical History:  Diagnosis Date  . Anxiety   . Depression   . Diabetes mellitus without complication ([place])   . Diabetic peripheral neuropathy ([place])   . Parkinson's disease ([place])   . Thyroid disease     Past Surgical History:  Procedure Laterality Date  . LEFT HEART CATHETERIZATION WITH CORONARY ANGIOGRAM N/A 01/02/2015   Procedure: LEFT HEART CATHETERIZATION WITH CORONARY ANGIOGRAM;  Surgeon: Adrian Prows, MD;  Location: [facility] CATH LAB;  Service: Cardiovascular;  Laterality: N/A;  . LUNG CANCER SURGERY      There were no vitals filed for this visit.      Subjective Assessment - 07/02/16 1601    Subjective Continues to have shoulder pain on a constant basis. The intensity of the pain is same as it has has been in the past few weeks. The pain has improved from the first days after surgery and is better than the pain was before surgery.    Currently in Pain? Yes   Pain Score 1    Pain Location Shoulder   Pain Orientation Right   Pain Descriptors / Indicators Aching;Dull   Pain Type Chronic pain   Pain Onset More than a month ago   Pain Frequency Constant   Aggravating Factors  moving or using arm    Pain Relieving Factors meds             [facility] PT Assessment - 07/02/16 0001      Assessment   Medical Diagnosis Rt shoulder DCR/SAD    Referring Provider Dr Sydnee Cabal   Onset  Date/Surgical Date 06/06/16   Hand Dominance Right   Next MD Visit 07/18/16   Prior Therapy yes      PROM   Right Shoulder Extension 80 Degrees   Right Shoulder Flexion 151 Degrees   Right Shoulder ABduction 148 Degrees   Right Shoulder Internal Rotation 58 Degrees   Right Shoulder External Rotation 84 Degrees                     [facility] Adult PT Treatment/Exercise - 07/02/16 0001      Shoulder Exercises: Standing   ABduction Strengthening;Both;Weights;20 reps  2 sets   Shoulder ABduction Weight (lbs) 2#   ABduction Limitations scaption w/noodle 2# x 20    Other Standing Exercises scaption with scap squeeze at noodle 2# ~80 deg 10x2sets; scapular/shoulder depression with blue TB x 20 Rt    Other Standing Exercises eccentric control in various positions hand resting on wall then stepping back to hold hand off wall stepping back to lower slowly x 10      Shoulder Exercises: Pulleys   Flexion --  10x10sec   ABduction --  10x10sec     Shoulder Exercises: Therapy Ball   Flexion 5 reps  stepping under ball standing at wall  Shoulder Exercises: ROM/Strengthening   UBE (Upper Arm Bike) L2 4 min alt fwd/back     Shoulder Exercises: Stretch   Corner Stretch 60 seconds  3 positions   Wall Stretch - Flexion 5 reps  wall ladder    Other Shoulder Stretches AAROM shd flex supine x 3      Shoulder Exercises: Body Blade   Flexion 30 seconds;2 reps   ABduction 30 seconds;2 reps     Cryotherapy   Number Minutes Cryotherapy 15 Minutes   Cryotherapy Location Shoulder  Rt   Type of Cryotherapy Ice pack     Electrical Stimulation   Electrical Stimulation Location Rt shoulder   Electrical Stimulation Action IFC   Electrical Stimulation Parameters to tolerance   Electrical Stimulation Goals Pain;Tone     Manual Therapy   Manual therapy comments pt supine    Joint Mobilization [place] mobs grade II   Soft tissue mobilization pecs; teres    Passive ROM  flex/ER/IR/abduction                     PT Long Term Goals - 07/02/16 1610      PT LONG TERM GOAL #1   Title Improve posture and alignment with pt to demonstrate upright posture and head and neck over body and shoulder blades down and back 07/18/16   Time 6   Period Weeks   Status Partially Met     PT LONG TERM GOAL #2   Title Improve Rt shoulder AROM to equal or greater than Lt 07/18/16   Time 6   Period Weeks   Status On-going     PT LONG TERM GOAL #3   Title Increase Rt UE strength to 4/5 to 5-/5   07/18/16   Time 6   Period Weeks   Status On-going     PT LONG TERM GOAL #4   Title Patient I in HEP 07/18/16   Period Weeks   Status On-going     PT LONG TERM GOAL #5   Title Improve FOTO to </= 45% limitation 07/18/16   Time 6   Period Weeks   Status On-going               Plan - 07/02/16 1607    Clinical Impression Statement Continued gradual progress with ROM and function; persistent pain. Pain is less intense than proir to surgery but does continue. Working on Chirop[employer]. Patient has 20 PT visits for the calendar year. While she would benefit from more frequent therapy but we will decrease to 1x/wk and assess response to more independent home program.    Rehab Potential Good   PT Frequency 2x / week   PT Duration 6 weeks   PT Treatment/Interventions Patient/family education;ADLs/Self Care Home Management;Neuromuscular re-education;Cryotherapy;Electrical Stimulation;Iontophoresis 97m/ml Dexamethasone;Moist Heat;Ultrasound;Manual techniques;Dry needling;Therapeutic activities;Therapeutic exercise   PT Next Visit Plan progress AAROM, scap stability; working on PROM/stretching focus on strengthening    Consulted and Agree with Plan of Care Patient      Patient will benefit from skilled therapeutic intervention in order to improve the following deficits and impairments:  Postural dysfunction, Improper body mechanics, Pain, Decreased  range of motion, Decreased mobility, Decreased strength, Impaired UE functional use, Decreased activity tolerance  Visit Diagnosis: Chronic right shoulder pain  Abnormal posture  Stiffness due to immobility  Muscle weakness (generalized)  Other symptoms and signs involving the musculoskeletal system     Problem List Patient Active Problem List   Diagnosis Date  Noted  . Chest pain with high risk for cardiac etiology 01/01/2015  . HYPERCHOLESTEROLEMIA 03/09/2009  . BRONCHITIS, RECURRENT 03/09/2009  . EMPHYSEMA 03/09/2009  . SLEEP APNEA 03/09/2009  . COUGH 03/09/2009    Solae Norling [name] PT,  MPH  07/02/2016, 4:46 PM  [facility] [place], [place], [address] Phone: [phone]   Fax:  [phone]  Name: Kelli Rojas MRN: [id number] Date of Birth: Feb 14, 1956

## 2016-07-04 ENCOUNTER — Encounter: Payer: 59 | Admitting: Rehabilitative and Restorative Service Providers"

## 2016-07-09 ENCOUNTER — Encounter: Payer: Self-pay | Admitting: Rehabilitative and Restorative Service Providers"

## 2016-07-09 ENCOUNTER — Ambulatory Visit (INDEPENDENT_AMBULATORY_CARE_PROVIDER_SITE_OTHER): Payer: 59 | Admitting: Rehabilitative and Restorative Service Providers"

## 2016-07-09 DIAGNOSIS — Z7409 Other reduced mobility: Secondary | ICD-10-CM

## 2016-07-09 DIAGNOSIS — M623 Immobility syndrome (paraplegic): Secondary | ICD-10-CM | POA: Diagnosis not present

## 2016-07-09 DIAGNOSIS — G8929 Other chronic pain: Secondary | ICD-10-CM

## 2016-07-09 DIAGNOSIS — M6281 Muscle weakness (generalized): Secondary | ICD-10-CM | POA: Diagnosis not present

## 2016-07-09 DIAGNOSIS — M2560 Stiffness of unspecified joint, not elsewhere classified: Secondary | ICD-10-CM

## 2016-07-09 DIAGNOSIS — R293 Abnormal posture: Secondary | ICD-10-CM | POA: Diagnosis not present

## 2016-07-09 DIAGNOSIS — M25511 Pain in right shoulder: Secondary | ICD-10-CM | POA: Diagnosis not present

## 2016-07-09 DIAGNOSIS — R29898 Other symptoms and signs involving the musculoskeletal system: Secondary | ICD-10-CM

## 2016-07-09 NOTE — Therapy (Signed)
[facility] [place], [place], [address] Phone: [phone]   Fax:  [phone]  Physical Therapy Treatment  Patient Details  Name: Kelli Rojas MRN: [id number] Date of Birth: [birth year] Referring Provider: Dr Sydnee Cabal  Encounter Date: 07/09/2016      PT End of Session - 07/09/16 1622    Visit Number 11   Number of Visits 18   Date for PT Re-Evaluation 07/18/16   PT Start Time 1601   PT Stop Time 1657   PT Time Calculation (min) 56 min   Activity Tolerance Patient tolerated treatment well      Past Medical History:  Diagnosis Date  . Anxiety   . Depression   . Diabetes mellitus without complication ([place])   . Diabetic peripheral neuropathy ([place])   . Parkinson's disease ([place])   . Thyroid disease     Past Surgical History:  Procedure Laterality Date  . LEFT HEART CATHETERIZATION WITH CORONARY ANGIOGRAM N/A 01/02/2015   Procedure: LEFT HEART CATHETERIZATION WITH CORONARY ANGIOGRAM;  Surgeon: Adrian Prows, MD;  Location: [facility] CATH LAB;  Service: Cardiovascular;  Laterality: N/A;  . LUNG CANCER SURGERY      There were no vitals filed for this visit.          [facility] PT Assessment - 07/09/16 0001      Assessment   Medical Diagnosis Rt shoulder DCR/SAD    Referring Provider Dr Sydnee Cabal   Onset Date/Surgical Date 06/06/16   Hand Dominance Right   Next MD Visit 07/18/16   Prior Therapy yes      PROM   Right Shoulder Flexion 156 Degrees   Right Shoulder ABduction 154 Degrees   Right Shoulder Internal Rotation 70 Degrees     Strength   Overall Strength Comments Rt shoulder flex; ext; IR 5/5. abd; ER 5-/5                     [facility] Adult PT Treatment/Exercise - 07/09/16 0001      Shoulder Exercises: Standing   Extension Strengthening;Both;20 reps   Theraband Level (Shoulder Extension) Level 4 (Blue)   Row Strengthening;Both;20 reps   Theraband Level (Shoulder Row)  Level 4 (Blue)     Shoulder Exercises: ROM/Strengthening   UBE (Upper Arm Bike) L3 4 min alt fwd/back     Shoulder Exercises: Stretch   Corner Stretch 60 seconds  3 positions   Wall Stretch - Flexion 5 reps  wall ladder    Other Shoulder Stretches AAROM shd flex supine x 3      Shoulder Exercises: Body Blade   Flexion 30 seconds;2 reps   ABduction 30 seconds;2 reps     Cryotherapy   Number Minutes Cryotherapy 15 Minutes   Cryotherapy Location Shoulder  Rt   Type of Cryotherapy Ice pack     Electrical Stimulation   Electrical Stimulation Location Rt shoulder   Electrical Stimulation Action IFC   Electrical Stimulation Parameters to tolerance   Electrical Stimulation Goals Pain;Tone     Manual Therapy   Manual therapy comments pt supine    Joint Mobilization GH mobs grade II   Soft tissue mobilization pecs; teres    Passive ROM flex/ER/IR/abduction                     PT Long Term Goals - 07/09/16 1623      PT LONG TERM GOAL #1   Title Improve posture and alignment with  pt to demonstrate upright posture and head and neck over body and shoulder blades down and back 07/18/16   Time 6   Period Weeks   Status Partially Met     PT LONG TERM GOAL #2   Title Improve Rt shoulder AROM to equal or greater than Lt 07/18/16   Time 6   Period Weeks   Status On-going     PT LONG TERM GOAL #3   Title Increase Rt UE strength to 4/5 to 5-/5   07/18/16   Time 6   Period Weeks   Status Partially Met     PT LONG TERM GOAL #4   Title Patient I in HEP 07/18/16   Time 6   Period Weeks   Status On-going     PT LONG TERM GOAL #5   Title Improve FOTO to </= 45% limitation 07/18/16   Time 6   Period Weeks   Status On-going               Plan - 07/09/16 1624    Clinical Impression Statement Progressing well. Decreased pain. Patient reports increased work on activities and exercises at home. Good progress with less pain and improved mobility.    Rehab  Potential Good   PT Frequency 1x / week   PT Duration 8 weeks   PT Treatment/Interventions Patient/family education;ADLs/Self Care Home Management;Neuromuscular re-education;Cryotherapy;Electrical Stimulation;Iontophoresis 27m/ml Dexamethasone;Moist Heat;Ultrasound;Manual techniques;Dry needling;Therapeutic activities;Therapeutic exercise   PT Next Visit Plan progress AAROM, scap stability; working on PROM/stretching focus on strengthening    Consulted and Agree with Plan of Care Patient      Patient will benefit from skilled therapeutic intervention in order to improve the following deficits and impairments:  Postural dysfunction, Improper body mechanics, Pain, Decreased range of motion, Decreased mobility, Decreased strength, Impaired UE functional use, Decreased activity tolerance  Visit Diagnosis: Chronic right shoulder pain  Abnormal posture  Stiffness due to immobility  Muscle weakness (generalized)  Other symptoms and signs involving the musculoskeletal system     Problem List Patient Active Problem List   Diagnosis Date Noted  . Chest pain with high risk for cardiac etiology 01/01/2015  . HYPERCHOLESTEROLEMIA 03/09/2009  . BRONCHITIS, RECURRENT 03/09/2009  . EMPHYSEMA 03/09/2009  . SLEEP APNEA 03/09/2009  . COUGH 03/09/2009    Celyn PNilda SimmerPT, MPH 07/09/2016, 4:50 PM  [facility] [place] [address]Phone: [phone]  Fax:  [phone] Name: Kelli SHUARTMRN: [id number] of Birth: 1Jul 18, 1957

## 2016-07-16 ENCOUNTER — Encounter: Payer: Self-pay | Admitting: Rehabilitative and Restorative Service Providers"

## 2016-07-16 ENCOUNTER — Ambulatory Visit (INDEPENDENT_AMBULATORY_CARE_PROVIDER_SITE_OTHER): Payer: 59 | Admitting: Rehabilitative and Restorative Service Providers"

## 2016-07-16 DIAGNOSIS — R293 Abnormal posture: Secondary | ICD-10-CM

## 2016-07-16 DIAGNOSIS — M2560 Stiffness of unspecified joint, not elsewhere classified: Secondary | ICD-10-CM

## 2016-07-16 DIAGNOSIS — M6281 Muscle weakness (generalized): Secondary | ICD-10-CM

## 2016-07-16 DIAGNOSIS — M25511 Pain in right shoulder: Secondary | ICD-10-CM | POA: Diagnosis not present

## 2016-07-16 DIAGNOSIS — M623 Immobility syndrome (paraplegic): Secondary | ICD-10-CM

## 2016-07-16 DIAGNOSIS — R29898 Other symptoms and signs involving the musculoskeletal system: Secondary | ICD-10-CM

## 2016-07-16 DIAGNOSIS — G8929 Other chronic pain: Secondary | ICD-10-CM

## 2016-07-16 DIAGNOSIS — Z7409 Other reduced mobility: Secondary | ICD-10-CM

## 2016-07-16 NOTE — Therapy (Addendum)
[place], [place], [address] Phone: [phone]   Fax:  [phone]  Physical Therapy Treatment  Patient Details  Name: Kelli Rojas MRN: [id number] Date of Birth: 1956/06/27 Referring Provider: Dr Sydnee Cabal  Encounter Date: 07/16/2016      PT End of Session - 07/16/16 1609    Visit Number 12   Number of Visits 18   Date for PT Re-Evaluation 07/18/16   PT Start Time 1194   PT Stop Time 1645   PT Time Calculation (min) 41 min   Activity Tolerance Patient tolerated treatment well      Past Medical History:  Diagnosis Date  . Anxiety   . Depression   . Diabetes mellitus without complication ([place])   . Diabetic peripheral neuropathy ([place])   . Parkinson's disease ([place])   . Thyroid disease     Past Surgical History:  Procedure Laterality Date  . LEFT HEART CATHETERIZATION WITH CORONARY ANGIOGRAM N/A 01/02/2015   Procedure: LEFT HEART CATHETERIZATION WITH CORONARY ANGIOGRAM;  Surgeon: Adrian Prows, MD;  Location: [facility] CATH LAB;  Service: Cardiovascular;  Laterality: N/A;  . LUNG CANCER SURGERY      There were no vitals filed for this visit.      Subjective Assessment - 07/16/16 1606    Subjective Shoulder continues to improve. She has been working on [employer]. Can now use Rt UE for all functional activities. Pain continues to be present with vacuuming and stretching exercises in PT.    Currently in Pain? Yes   Pain Score 1    Pain Location Shoulder   Pain Orientation Right   Pain Descriptors / Indicators Aching;Dull   Pain Type Chronic pain   Pain Onset More than a month ago   Pain Frequency Intermittent   Aggravating Factors  vacuuming; stretching at end of range especially in PT            [facility] PT Assessment - 07/16/16 0001      Assessment   Medical Diagnosis Rt shoulder DCR/SAD    Referring Provider Dr Sydnee Cabal   Onset Date/Surgical Date 06/06/16   Hand Dominance Right    Next MD Visit 07/18/16   Prior Therapy yes      Observation/Other Assessments   Focus on Therapeutic Outcomes (FOTO)  29% limitation      AROM   Right Shoulder Extension 65 Degrees   Right Shoulder Flexion 155 Degrees   Right Shoulder ABduction 146 Degrees   Right Shoulder Internal Rotation 40 Degrees   Right Shoulder External Rotation 90 Degrees   Left Shoulder Extension 45 Degrees   Left Shoulder Flexion 141 Degrees   Left Shoulder ABduction 152 Degrees   Left Shoulder Internal Rotation 33 Degrees   Left Shoulder External Rotation 84 Degrees     PROM   Right Shoulder Extension 95 Degrees   Right Shoulder Flexion 156 Degrees   Right Shoulder ABduction 155 Degrees   Right Shoulder Internal Rotation 70 Degrees   Right Shoulder External Rotation 90 Degrees     Strength   Overall Strength Comments Rt shoulder 5/5 throughout                      [facility] Adult PT Treatment/Exercise - 07/16/16 0001      Shoulder Exercises: ROM/Strengthening   UBE (Upper Arm Bike) L3 4 min alt fwd/back     Shoulder Exercises: Stretch   Corner Stretch 60 seconds  3  positions   Wall Stretch - Flexion 5 reps  wall ladder      Manual Therapy   Manual therapy comments pt supine    Joint Mobilization GH mobs grade II   Soft tissue mobilization pecs; teres    Passive ROM flex/ER/IR/abduction      Review of all HEP  Education re the importance of consistent HEP                PT Long Term Goals - 07/16/16 1639      PT LONG TERM GOAL #1   Title Improve posture and alignment with pt to demonstrate upright posture and head and neck over body and shoulder blades down and back 07/18/16   Time 6   Period Weeks   Status Achieved     PT LONG TERM GOAL #2   Title Improve Rt shoulder AROM to equal or greater than Lt 07/18/16   Time 6   Period Weeks   Status Achieved     PT LONG TERM GOAL #3   Title Increase Rt UE strength to 4/5 to 5-/5   07/18/16  Strength 5/5  throughtout Rt shd    Time 6   Period Weeks   Status Achieved     PT LONG TERM GOAL #4   Title Patient I in HEP 07/18/16   Time 6   Period Weeks   Status Achieved     PT LONG TERM GOAL #5   Title Improve FOTO to </= 45% limitation 07/18/16 FOTO 29% limitation    Time 6   Period Weeks   Status Achieved               Plan - 07/16/16 1613    Clinical Impression Statement Progressing well. ROM Rt shoudler equal or greater than Lt UE. Strength is 5/5 throughout Rt shoulder musculature. There is minimal tenderness to palpation. Patient has returned to all normal functional activities without difficulty. She has some pain after certain household chores but the pain resolves with rest. Patient feels confident in continuing independent HEP. Goals of PT have been accomplished.    Rehab Potential Good   PT Frequency 2x / week   PT Duration 8 weeks   PT Treatment/Interventions Patient/family education;ADLs/Self Care Home Management;Neuromuscular re-education;Cryotherapy;Electrical Stimulation;Iontophoresis 74m/ml Dexamethasone;Moist Heat;Ultrasound;Manual techniques;Dry needling;Therapeutic activities;Therapeutic exercise   PT Next Visit Plan D/C to independent HEP - emphasized importance of continued consistent HEP    Consulted and Agree with Plan of Care Patient      Patient will benefit from skilled therapeutic intervention in order to improve the following deficits and impairments:  Postural dysfunction, Improper body mechanics, Pain, Decreased range of motion, Decreased mobility, Decreased strength, Impaired UE functional use, Decreased activity tolerance  Visit Diagnosis: Chronic right shoulder pain  Abnormal posture  Stiffness due to immobility  Muscle weakness (generalized)  Other symptoms and signs involving the musculoskeletal system     Problem List Patient Active Problem List   Diagnosis Date Noted  . Chest pain with high risk for cardiac etiology 01/01/2015   . HYPERCHOLESTEROLEMIA 03/09/2009  . BRONCHITIS, RECURRENT 03/09/2009  . EMPHYSEMA 03/09/2009  . SLEEP APNEA 03/09/2009  . COUGH 03/09/2009    Ector Laurel PNilda SimmerPT, MPH  07/16/2016, 4:42 PM  [facility] [address]Phone: [phone]  Fax:  [phone] Name: CMEKAYLA SOMANMRN: [id number] of Birth: 1July 07, 1957 PHYSICAL THERAPY DISCHARGE SUMMARY  Visits from  Start of Care: 12  Current functional level related to goals / functional outcomes: See last PT progress note for discharge status   Remaining deficits: Should continue with stretching and strengthening exercise program   Education / Equipment: HEP Plan: Patient agrees to discharge.  Patient goals were met. Patient is being discharged due to meeting the stated rehab goals.  ?????    Kelli Hickson P. Helene Kelp PT, MPH 08/27/16 3:44 PM

## 2016-07-23 ENCOUNTER — Encounter: Payer: 59 | Admitting: Rehabilitative and Restorative Service Providers"

## 2017-01-13 DIAGNOSIS — C3491 Malignant neoplasm of unspecified part of right bronchus or lung: Secondary | ICD-10-CM | POA: Insufficient documentation

## 2017-11-04 ENCOUNTER — Other Ambulatory Visit (HOSPITAL_COMMUNITY): Payer: Self-pay | Admitting: Endocrinology

## 2017-11-04 ENCOUNTER — Ambulatory Visit (HOSPITAL_COMMUNITY)
Admission: RE | Admit: 2017-11-04 | Discharge: 2017-11-04 | Disposition: A | Discharge status: Home / Self Care | Source: Ambulatory Visit | Attending: Vascular Surgery | Admitting: Vascular Surgery

## 2017-11-04 DIAGNOSIS — I6523 Occlusion and stenosis of bilateral carotid arteries: Secondary | ICD-10-CM | POA: Diagnosis not present

## 2017-11-04 LAB — VAS US CAROTID
LEFT ECA DIAS: -7 cm/s
LEFT VERTEBRAL DIAS: -15 cm/s
Left CCA dist dias: -9 cm/s
Left CCA dist sys: -55 cm/s
Left CCA prox dias: 12 cm/s
Left CCA prox sys: 84 cm/s
Left ICA dist dias: -20 cm/s
Left ICA dist sys: -61 cm/s
Left ICA prox dias: 11 cm/s
Left ICA prox sys: 35 cm/s
RIGHT CCA MID DIAS: 10 cm/s
RIGHT ECA DIAS: -6 cm/s
RIGHT VERTEBRAL DIAS: 12 cm/s
Right CCA prox dias: 8 cm/s
Right CCA prox sys: 64 cm/s
Right cca dist sys: -63 cm/s

## 2018-03-22 HISTORY — PX: MOUTH SURGERY: SHX715

## 2018-06-17 ENCOUNTER — Institutional Professional Consult (permissible substitution): Payer: Self-pay | Admitting: Neurology

## 2018-06-18 ENCOUNTER — Emergency Department (INDEPENDENT_AMBULATORY_CARE_PROVIDER_SITE_OTHER)

## 2018-06-18 ENCOUNTER — Emergency Department (INDEPENDENT_AMBULATORY_CARE_PROVIDER_SITE_OTHER)
Admission: EM | Admit: 2018-06-18 | Discharge: 2018-06-18 | Disposition: A | Discharge status: Home / Self Care | Source: Home / Self Care | Attending: Family Medicine | Admitting: Family Medicine

## 2018-06-18 ENCOUNTER — Other Ambulatory Visit: Payer: Self-pay

## 2018-06-18 DIAGNOSIS — X58XXXA Exposure to other specified factors, initial encounter: Secondary | ICD-10-CM | POA: Diagnosis not present

## 2018-06-18 DIAGNOSIS — S92502A Displaced unspecified fracture of left lesser toe(s), initial encounter for closed fracture: Secondary | ICD-10-CM

## 2018-06-18 DIAGNOSIS — S92512A Displaced fracture of proximal phalanx of left lesser toe(s), initial encounter for closed fracture: Secondary | ICD-10-CM | POA: Diagnosis not present

## 2018-06-18 NOTE — ED Triage Notes (Signed)
Pt woke up with left foot pain, that has been getting progressively worse as the day has progressed.  Denied injury

## 2018-06-18 NOTE — Discharge Instructions (Signed)
You may take Tylenol and Advil for pain in your toe. Toe help your toe heal, you should wear to boot so you do not accidentally re-injure your toe.  You may remove the boot to bath and to apply a cool compress but you should otherwise keep the boot on.  Please call your family doctor/endocrinologist next week to let him know you have a broken toe and are not sure how you did it. He may want to order another bone scan.  If needed, please call to schedule an appointment with Sports Medicine for continued care of your broken Left middle toe.

## 2018-06-18 NOTE — ED Provider Notes (Signed)
[facility]    CSN: [id number] Arrival date & time: 06/18/18  1535     History   Chief Complaint Chief Complaint  Patient presents with  . Foot Pain    HPI Kelli Rojas is a 62 y.o. female.   HPI Kelli Rojas is a 62 y.o. female presenting to UC with c/o new onset Left middle toe pain, swelling and bruising that started this morning, gradually worsening. No known injury. Pt does have a hx of diabetic peripheral neuropathy listed in her medical records but denies decreased sensation in her feet. No prior foot fracture. She has not taken anything for pain. Pain is worse with weightbearing, aching and sore. Moderate in severity.    Past Medical History:  Diagnosis Date  . Anxiety   . Depression   . Diabetes mellitus without complication ([place])   . Diabetic peripheral neuropathy ([place])   . Parkinson's disease ([place])   . Thyroid disease     Patient Active Problem List   Diagnosis Date Noted  . Chest pain with high risk for cardiac etiology 01/01/2015  . HYPERCHOLESTEROLEMIA 03/09/2009  . BRONCHITIS, RECURRENT 03/09/2009  . EMPHYSEMA 03/09/2009  . SLEEP APNEA 03/09/2009  . COUGH 03/09/2009    Past Surgical History:  Procedure Laterality Date  . LEFT HEART CATHETERIZATION WITH CORONARY ANGIOGRAM N/A 01/02/2015   Procedure: LEFT HEART CATHETERIZATION WITH CORONARY ANGIOGRAM;  Surgeon: Adrian Prows, MD;  Location: [facility] CATH LAB;  Service: Cardiovascular;  Laterality: N/A;  . LUNG CANCER SURGERY      OB History   None      Home Medications    Prior to Admission medications   Medication Sig Start Date End Date Taking? Authorizing Provider  ARIPiprazole (ABILIFY) 10 MG tablet Take 15 mg by mouth daily. Reported on 11/15/2015    [provider]  aspirin 81 MG tablet Take 81 mg by mouth daily.    [provider]  atorvastatin (LIPITOR) 40 MG tablet Take 40 mg by mouth daily.    [provider]  Calcium Citrate-Vitamin D (CALCIUM + D PO)  Take by mouth daily.    [provider]  carbidopa-levodopa (SINEMET IR) 25-100 MG per tablet Take 1 tablet by mouth 3 (three) times daily. Reported on 11/15/2015    [provider]  Cyanocobalamin (B-12) 2000 MCG TABS Take 1 tablet by mouth daily.    [provider]  Empagliflozin-Metformin HCl 01-999 MG TABS Take 1 tablet by mouth daily. 01/03/15   Adrian Prows, MD  ergocalciferol (VITAMIN D2) 50000 UNITS capsule Take 50,000 Units by mouth once a week. Saturday    [provider]  gabapentin (NEURONTIN) 100 MG capsule Take 100 mg by mouth daily.     [provider]  ketoconazole (NIZORAL) 2 % shampoo Apply 1 application topically once a week. 11/13/14   [provider]  levothyroxine (SYNTHROID, LEVOTHROID) 112 MCG tablet Take 112 mcg by mouth daily before breakfast.    [provider]  LORazepam (ATIVAN) 2 MG tablet Take 2 mg by mouth at bedtime.    [provider]  metoprolol succinate (TOPROL XL) 25 MG 24 hr tablet Take 25 mg by mouth daily.    [provider]  NITROSTAT 0.4 MG SL tablet Place 0.4 mg under the tongue every 5 (five) minutes x 3 doses as needed. 12/04/14   [provider]  omeprazole (PRILOSEC) 40 MG capsule Take 40 mg by mouth daily.    [provider]  rasagiline (AZILECT) 1 MG TABS tablet Take 1 mg by mouth daily. Reported on 11/15/2015    [provider]  sertraline (ZOLOFT) 100 MG tablet Take 100 mg by mouth daily.    [provider]  Suvorexant (BELSOMRA) 20 MG TABS Take by mouth at bedtime. Reported on 11/15/2015    [provider]    Family History Family History  Problem Relation Age of Onset  . Heart failure Mother   . Non-Hodgkin's lymphoma Father   . Diabetes Sister   . Heart disease Sister     Social History Social History   Tobacco Use  . Smoking status: Former Smoker    Last attempt to quit: 06/26/2010    Years since quitting: 7.9    Substance Use Topics  . Alcohol use: No    Alcohol/week: 0.0 standard drinks  . Drug use: No     Allergies   Codeine; Crestor  [rosuvastatin]; Hydromorphone hcl; and Trazodone and nefazodone   Review of Systems Review of Systems  Musculoskeletal: Positive for arthralgias, joint swelling and myalgias.  Skin: Positive for color change. Negative for wound.  Neurological: Negative for weakness and numbness.     Physical Exam Triage Vital Signs ED Triage Vitals  Enc Vitals Group     BP 06/18/18 1558 139/84     Pulse Rate 06/18/18 1558 93     Resp --      Temp 06/18/18 1558 98.3 F (36.8 C)     Temp Source 06/18/18 1558 Oral     SpO2 06/18/18 1558 96 %     Weight 06/18/18 1559 181 lb (82.1 kg)     Height 06/18/18 1559 5' 3.5" (1.613 m)     Head Circumference --      Peak Flow --      Pain Score 06/18/18 1558 4     Pain Loc --      Pain Edu? --      Excl. in [place]? --    No data found.  Updated Vital Signs BP 139/84 (BP Location: Right Arm)   Pulse 93   Temp 98.3 F (36.8 C) (Oral)   Ht 5' 3.5" (1.613 m)   Wt 181 lb (82.1 kg)   SpO2 96%   BMI 31.56 kg/m   Visual Acuity Right Eye Distance:   Left Eye Distance:   Bilateral Distance:    Right Eye Near:   Left Eye Near:    Bilateral Near:     Physical Exam  Constitutional: She is oriented to person, place, and time. She appears well-developed and well-nourished.  HENT:  Head: Normocephalic and atraumatic.  Eyes: EOM are normal.  Neck: Normal range of motion.  Cardiovascular: Normal rate.  Pulmonary/Chest: Effort normal.  Musculoskeletal: Normal range of motion. She exhibits edema and tenderness.  Left middle toes: mild edema, tender. Full ROM  Neurological: She is alert and oriented to person, place, and time.  Skin: Skin is warm and dry. Capillary refill takes less than 2 seconds.  Left middle toe: skin in tact, faint eccymosis  Psychiatric: She has a normal mood and affect. Her behavior is normal.   Nursing note and vitals reviewed.    UC Treatments / Results  Labs (all labs ordered are listed, but only abnormal results are displayed) Labs Reviewed - No data to display  EKG None  Radiology Dg Foot Complete Left  Result Date: 06/18/2018 CLINICAL DATA:  Pain and bruising in the third toe. EXAM: LEFT FOOT - COMPLETE  3+ VIEW COMPARISON:  None. FINDINGS: A minimally displaced fracture is present at the head of the proximal phalanx in the third digit. The joints are located. No additional fractures are present. IMPRESSION: Minimally displaced fracture involving the head of the proximal phalanx in the third digit. Electronically Signed   By: San Morelle M.D.   On: 06/18/2018 16:25    Procedures Procedures (including critical care time)  Medications Ordered in UC Medications - No data to display  Initial Impression / Assessment and Plan / UC Course  I have reviewed the triage vital signs and the nursing notes.  Pertinent labs & imaging results that were available during my care of the patient were reviewed by me and considered in my medical decision making (see chart for details).     Hx and exam c/w closed fracture of Left 3rd toe. Due to pt's hx of diabetic peripheral neuropathy and unknown injury, will place pt in a walking boot to help protect foot as it heels. F/u with PCP and Sports Medicine.   Final Clinical Impressions(s) / UC Diagnoses   Final diagnoses:  Closed fracture of phalanx of left third toe, initial encounter     Discharge Instructions     You may take Tylenol and Advil for pain in your toe. Toe help your toe heal, you should wear to boot so you do not accidentally re-injure your toe.  You may remove the boot to bath and to apply a cool compress but you should otherwise keep the boot on.  Please call your family doctor/endocrinologist next week to let him know you have a broken toe and are not sure how you did it. He may want to order another  bone scan.  If needed, please call to schedule an appointment with Sports Medicine for continued care of your broken Left middle toe.     ED Prescriptions    None     Controlled Substance Prescriptions [place] Controlled Substance Registry consulted? Not Applicable   Tyrell Antonio 06/18/18 [id number]

## 2018-07-02 ENCOUNTER — Ambulatory Visit (INDEPENDENT_AMBULATORY_CARE_PROVIDER_SITE_OTHER)

## 2018-07-02 ENCOUNTER — Encounter: Payer: Self-pay | Admitting: Family Medicine

## 2018-07-02 ENCOUNTER — Ambulatory Visit (INDEPENDENT_AMBULATORY_CARE_PROVIDER_SITE_OTHER): Admitting: Family Medicine

## 2018-07-02 VITALS — BP 135/54 | HR 79 | Wt 183.0 lb

## 2018-07-02 DIAGNOSIS — S92512D Displaced fracture of proximal phalanx of left lesser toe(s), subsequent encounter for fracture with routine healing: Secondary | ICD-10-CM | POA: Diagnosis not present

## 2018-07-02 DIAGNOSIS — S92515A Nondisplaced fracture of proximal phalanx of left lesser toe(s), initial encounter for closed fracture: Secondary | ICD-10-CM | POA: Diagnosis not present

## 2018-07-02 DIAGNOSIS — X58XXXD Exposure to other specified factors, subsequent encounter: Secondary | ICD-10-CM

## 2018-07-02 NOTE — Progress Notes (Signed)
Subjective:    CC: Left toe fracture  HPI: Kelli Rojas injured her toe about 2 weeks ago.  She was seen in urgent care on September 20 where she was found to have a comminuted intra-articular fracture of her left third toe distal proximal phalanx.  She was treated with a Cam walker boot and buddy taping.  She presents to clinic today noting that she still having significant pain and swelling in her left foot.  She denies any repeat injury.  No fevers or chills nausea vomiting or diarrhea.  Medical history pertinent for diabetes not ideally controlled.  She has a history also of peripheral neuropathy.  Past medical history, Surgical history, Family history not pertinant except as noted below, Social history, Allergies, and medications have been entered into the medical record, reviewed, and no changes needed.   Review of Systems: No headache, visual changes, nausea, vomiting, diarrhea, constipation, dizziness, abdominal pain, skin rash, fevers, chills, night sweats, weight loss, swollen lymph nodes, body aches, joint swelling, muscle aches, chest pain, shortness of breath, mood changes, visual or auditory hallucinations.   Objective:    Vitals:   07/02/18 0846  BP: (!) 135/54  Pulse: 79   General: Well Developed, well nourished, and in no acute distress.  Neuro/Psych: Alert and oriented x3, extra-ocular muscles intact, able to move all 4 extremities, sensation grossly intact. Skin: Warm and dry, no rashes noted.  Respiratory: Not using accessory muscles, speaking in full sentences, trachea midline.  Cardiovascular: Pulses palpable, no extremity edema. Abdomen: Does not appear distended. MSK:  Left foot swollen and tender third toe with some swelling at the base of the toe as well.  No skin breakdown or erythema.  Pulses cap refill and sensation are intact.  Aside from toe motion foot and ankle motion are intact.  Lab and Radiology Results No results found for this or any previous  visit (from the past 72 hour(s)). Dg Foot Complete Left  Result Date: 07/02/2018 CLINICAL DATA:  Left third digit fracture.  Injury 2 weeks ago. EXAM: LEFT FOOT - COMPLETE 3+ VIEW COMPARISON:  No prior. FINDINGS: Minimally displaced fracture of the distal aspect of the proximal phalanx of the third digit of the left foot is noted. No interim change from prior exam. IMPRESSION: Stable minimally displaced fracture of the distal aspect of the proximal phalanx of the left third digit. No interim change from prior exam. Electronically Signed   By: Inez   On: 07/02/2018 09:30   I personally (independently) visualized and performed the interpretation of the images attached in this note.  Impression and Recommendations:    Assessment and Plan: 62 y.o. female with left third toe fracture.  Comminuted and intra-articular.  This may have a prolonged course given her history of diabetes.  Plan for continued buddy tape and will switch to a postop shoe.  Recheck in 2 weeks.  Return sooner if needed..   Orders Placed This Encounter  Procedures  . DG Foot Complete Left    Standing Status:   Future    Number of Occurrences:   1    Standing Expiration Date:   09/02/2019    Order Specific Question:   Reason for Exam (SYMPTOM  OR DIAGNOSIS REQUIRED)    Answer:   toe fracture    Order Specific Question:   Preferred imaging location?    Answer:   [facility]    Order Specific Question:   Radiology Contrast Protocol - do NOT remove file  path    Answer:   \\[email or web address]   No orders of the defined types were placed in this encounter.   Discussed warning signs or symptoms. Please see discharge instructions. Patient expresses understanding.

## 2018-07-02 NOTE — Patient Instructions (Signed)
Thank you for coming in today. Continue to buddy tape the toes.  Try the smaller post op shoe.  Recheck in 2 weeks.  Return sooner if needed.   If not doing well recheck sooner.     Toe Fracture A toe fracture is a break in one of the toe bones (phalanges). Follow these instructions at home: If you have a cast:  Do not stick anything inside the cast to scratch your skin.  Check the skin around the cast every day. Tell your doctor about any concerns. Do not put lotion on the skin underneath the cast. You may put lotion on dry skin around the edges of the cast.  Do not put pressure on any part of the cast until it is fully hardened. This may take many hours.  Keep the cast clean and dry. Bathing  Do not take baths, swim, or use a hot tub until your doctor says that you can. Ask your doctor if you can take showers. You may only be allowed to take sponge baths for bathing.  If your doctor says that bathing and showering are okay, cover the cast or bandage (dressing) with a watertight plastic bag to protect it from water. Do not let the cast or bandage get wet. Managing pain, stiffness, and swelling  If you do not have a cast, put ice on the injured area if told by your doctor: ? Put ice in a plastic bag. ? Place a towel between your skin and the bag. ? Leave the ice on for 20 minutes, 2-3 times per day.  Move your toes often to avoid stiffness and to lessen swelling.  Raise (elevate) the injured area above the level of your heart while you are sitting or lying down. Driving  Do not drive or use heavy machinery while taking pain medicine.  Do not drive while wearing a cast on a foot that you use for driving. Activity  Return to your normal activities as told by your doctor. Ask your doctor what activities are safe for you.  Perform exercises daily as told by your doctor or therapist. Safety  Do not use your leg to support your body weight until your doctor says that you  can. Use crutches or other tools to help you move around as told by your doctor. General instructions  If your toe was taped to a toe that is next to it (buddy taping), follow your doctor's instructions for changing the gauze and tape. Change it more often: ? If the gauze and tape get wet. If this happens, dry the space between the toes. ? If the gauze and tape are too tight and they cause your toe to become pale or to lose feeling (numb).  Wear a protective shoe as told by your doctor. If you were not given one, wear sturdy shoes that support your foot. Your shoes should not pinch your toes. Your shoes should not fit tightly against your toes.  Do not use any tobacco products, including cigarettes, chewing tobacco, or e-cigarettes. Tobacco can delay bone healing. If you need help quitting, ask your doctor.  Take medicines only as told by your doctor.  Keep all follow-up visits as told by your doctor. This is important. Contact a doctor if:  You have a fever.  Your pain medicine is not helping.  Your toe feels cold.  You lose feeling (have numbness) in your toe.  You still have pain after one week of rest and treatment.  You  still have pain after your doctor has said that you can start walking again.  You have pain or tingling in your foot, and it is not going away.  You have loss of feeling in your foot, and it is not going away. Get help right away if:  You have severe pain.  You have redness or swelling (inflammation) in your toe, and it is getting worse.  You have pain or loss of feeling in your toe, and it is getting worse.  Your toe is blue. This information is not intended to replace advice given to you by your health care provider. Make sure you discuss any questions you have with your health care provider. Document Released: 03/03/2008 Document Revised: 05/19/2016 Document Reviewed: 07/12/2014 Elsevier Interactive Patient Education  [employer].

## 2018-07-16 ENCOUNTER — Ambulatory Visit (INDEPENDENT_AMBULATORY_CARE_PROVIDER_SITE_OTHER): Admitting: Family Medicine

## 2018-07-16 ENCOUNTER — Encounter: Payer: Self-pay | Admitting: Family Medicine

## 2018-07-16 ENCOUNTER — Ambulatory Visit (INDEPENDENT_AMBULATORY_CARE_PROVIDER_SITE_OTHER)

## 2018-07-16 VITALS — BP 119/95 | HR 83 | Ht 63.0 in | Wt 185.0 lb

## 2018-07-16 DIAGNOSIS — S92515A Nondisplaced fracture of proximal phalanx of left lesser toe(s), initial encounter for closed fracture: Secondary | ICD-10-CM | POA: Diagnosis not present

## 2018-07-16 DIAGNOSIS — S92515G Nondisplaced fracture of proximal phalanx of left lesser toe(s), subsequent encounter for fracture with delayed healing: Secondary | ICD-10-CM

## 2018-07-16 NOTE — Patient Instructions (Signed)
Thank you for coming in today. Recheck in 3 weeks.  Continue to buddy tape the toes.  Ok to try using a regular shoe as guided by pain.  If painful switch back to the post op shoe.  Return sooner if needed.

## 2018-07-16 NOTE — Progress Notes (Signed)
Kelli Rojas is a 62 y.o. female who presents to [facility] today for follow-up left third toe fracture.  Patient was originally diagnosed with a fracture on September 20 in urgent care.  She was originally treated with buddy taping and [employer] features [employer].  Follow-up on October 4 x-ray was stable and she was transitioned to a postop shoe and continued buddy taping.  She has a relevant medical history for diabetes.  Today she notes that she is feeling much better and notes that she only has very minimal pain.    ROS:  As above  Exam:  BP (!) 119/95   Pulse 83   Ht 5\' 3"  (1.6 m)   Wt 185 lb (83.9 kg)   BMI 32.77 kg/m  General: Well Developed, well nourished, and in no acute distress.  Neuro/Psych: Alert and oriented x3, extra-ocular muscles intact, able to move all 4 extremities, sensation grossly intact. Skin: Warm and dry, no rashes noted.  Respiratory: Not using accessory muscles, speaking in full sentences, trachea midline.  Cardiovascular: Pulses palpable, no extremity edema. Abdomen: Does not appear distended. MSK: Left foot: Third toe still slightly swollen but not erythematous minimally tender.  Capillary refill and sensation are intact distally.  Pulses intact and foot.    Lab and Radiology Results  Dg Foot Complete Left  Result Date: 07/16/2018 CLINICAL DATA:  62 year old female with a history of left third toe injury EXAM: LEFT FOOT - COMPLETE 3+ VIEW COMPARISON:  07/02/2018, 06/18/2018 FINDINGS: Partially healed fracture at the head of the proximal phalanx of the third digit left foot with unchanged configuration. No new fracture identified. No focal soft tissue swelling. No radiopaque foreign body. Degenerative changes of the midfoot. IMPRESSION: Incompletely healed/remodeled fracture at the head of the proximal phalanx, third digit, left foot Electronically Signed   By: Corrie Mckusick D.O.   On: 07/16/2018 09:31   I personally  (independently) visualized and performed the interpretation of the images attached in this note.    Assessment and Plan: 62 y.o. female with healing third toe fracture of left foot.  Symptomatically doing quite well.  Reasonable to continue buddy taping and try transitioning to more conventional shoes.  If not able to is limited by pain will transition back to postop shoe.  Recheck in 3 weeks.  I spent 15 minutes with this patient, greater than 50% was face-to-face time counseling regarding ddx and pain   Orders Placed This Encounter  Procedures  . DG Foot Complete Left    Standing Status:   Future    Number of Occurrences:   1    Standing Expiration Date:   09/16/2019    Order Specific Question:   Reason for Exam (SYMPTOM  OR DIAGNOSIS REQUIRED)    Answer:   S28.315    Order Specific Question:   Preferred imaging location?    Answer:   [facility]    Order Specific Question:   Radiology Contrast Protocol - do NOT remove file path    Answer:   \\[email or web address]   No orders of the defined types were placed in this encounter.   Historical information moved to improve visibility of documentation.  Past Medical History:  Diagnosis Date  . Anxiety   . Depression   . Diabetes mellitus without complication ([place])   . Diabetic peripheral neuropathy ([place])   . Parkinson's disease ([place])   . Thyroid disease    Past Surgical History:  Procedure Laterality Date  .  LEFT HEART CATHETERIZATION WITH CORONARY ANGIOGRAM N/A 01/02/2015   Procedure: LEFT HEART CATHETERIZATION WITH CORONARY ANGIOGRAM;  Surgeon: Adrian Prows, MD;  Location[facility] LAB;  Service: Cardiovascular;  Laterality: N/A;  . LUNG CANCER SURGERY     Social History   Tobacco Use  . Smoking status: Former Smoker    Last attempt to quit: 06/26/2010    Years since quitting: 8.0  . Smokeless tobacco: Never Used  Substance Use Topics  . Alcohol use: No    Alcohol/week: 0.0  standard drinks   family history includes Diabetes in her sister; Heart disease in her sister; Heart failure in her mother; Non-Hodgkin's lymphoma in her father.  Medications: Current Outpatient Medications  Medication Sig Dispense Refill  . ARIPiprazole (ABILIFY) 10 MG tablet Take 15 mg by mouth daily. Reported on 11/15/2015    . aspirin 81 MG tablet Take 81 mg by mouth daily.    Marland Kitchen atorvastatin (LIPITOR) 40 MG tablet Take 40 mg by mouth daily.    . Calcium Citrate-Vitamin D (CALCIUM + D PO) Take by mouth daily.    . carbidopa-levodopa (SINEMET IR) 25-100 MG per tablet Take 1 tablet by mouth 3 (three) times daily. Reported on 11/15/2015    . Cyanocobalamin (B-12) 2000 MCG TABS Take 1 tablet by mouth daily.    . Empagliflozin-Metformin HCl 01-999 MG TABS Take 1 tablet by mouth daily. 60 tablet   . ergocalciferol (VITAMIN D2) 50000 UNITS capsule Take 50,000 Units by mouth once a week. Saturday    . gabapentin (NEURONTIN) 100 MG capsule Take 100 mg by mouth daily.     Marland Kitchen ketoconazole (NIZORAL) 2 % shampoo Apply 1 application topically once a week.  0  . levothyroxine (SYNTHROID, LEVOTHROID) 112 MCG tablet Take 112 mcg by mouth daily before breakfast.    . LORazepam (ATIVAN) 2 MG tablet Take 2 mg by mouth at bedtime.    . metoprolol succinate (TOPROL XL) 25 MG 24 hr tablet Take 25 mg by mouth daily.    Marland Kitchen NITROSTAT 0.4 MG SL tablet Place 0.4 mg under the tongue every 5 (five) minutes x 3 doses as needed.  0  . omeprazole (PRILOSEC) 40 MG capsule Take 40 mg by mouth daily.    . rasagiline (AZILECT) 1 MG TABS tablet Take 1 mg by mouth daily. Reported on 11/15/2015    . sertraline (ZOLOFT) 100 MG tablet Take 100 mg by mouth daily.    . Suvorexant (BELSOMRA) 20 MG TABS Take by mouth at bedtime. Reported on 11/15/2015     No current facility-administered medications for this visit.    Allergies  Allergen Reactions  . Codeine   . Crestor  [Rosuvastatin]     myalgia  . Hydromorphone Hcl   .  Trazodone And Nefazodone     HA, sleepy hangover      Discussed warning signs or symptoms. Please see discharge instructions. Patient expresses understanding.

## 2018-07-30 ENCOUNTER — Other Ambulatory Visit: Payer: Self-pay | Admitting: Family Medicine

## 2018-07-30 ENCOUNTER — Ambulatory Visit (INDEPENDENT_AMBULATORY_CARE_PROVIDER_SITE_OTHER): Admitting: Family Medicine

## 2018-07-30 ENCOUNTER — Ambulatory Visit (INDEPENDENT_AMBULATORY_CARE_PROVIDER_SITE_OTHER)

## 2018-07-30 VITALS — BP 126/78 | HR 93 | Ht 64.0 in | Wt 182.0 lb

## 2018-07-30 DIAGNOSIS — S92515A Nondisplaced fracture of proximal phalanx of left lesser toe(s), initial encounter for closed fracture: Secondary | ICD-10-CM | POA: Insufficient documentation

## 2018-07-30 DIAGNOSIS — S92515D Nondisplaced fracture of proximal phalanx of left lesser toe(s), subsequent encounter for fracture with routine healing: Secondary | ICD-10-CM

## 2018-07-30 DIAGNOSIS — X58XXXD Exposure to other specified factors, subsequent encounter: Secondary | ICD-10-CM | POA: Diagnosis not present

## 2018-07-30 DIAGNOSIS — M7742 Metatarsalgia, left foot: Secondary | ICD-10-CM | POA: Diagnosis not present

## 2018-07-30 NOTE — Progress Notes (Signed)
Kelli Rojas is a 62 y.o. female who presents to [facility] today for follow-up fracture left third toe.  Patient suffered a fracture of her left third toe on about September 20.  She is been immobilized initially and is currently treated with buddy tape.  She feels well and denies any pain at the site of her fracture at her PIP.  She does note some pain at the plantar MTP.  She feels well and happy with how things are going.  She notes the plantar MTP pain is worse with activity and better with rest.  Is not tried much treatment yet.    ROS:  As above  Exam:  BP 126/78   Pulse 93   Ht 5\' 4"  (1.626 m)   Wt 182 lb (82.6 kg)   BMI 31.24 kg/m  General: Well Developed, well nourished, and in no acute distress.  Neuro/Psych: Alert and oriented x3, extra-ocular muscles intact, able to move all 4 extremities, sensation grossly intact. Skin: Warm and dry, no rashes noted.  Respiratory: Not using accessory muscles, speaking in full sentences, trachea midline.  Cardiovascular: Pulses palpable, no extremity edema. Abdomen: Does not appear distended. MSK:  Left foot: Slightly swollen third toe.  Normal appearing otherwise. Not particularly tender at PIP.  Mildly tender to palpation plantar third metatarsal head. Pulses intact in the foot.  Capillary refill and sensation intact distally.    Lab and Radiology Results X-ray left foot images personally independently reviewed. Fracture line visible at the distal end of proximal third phalanx.  Fracture line however is less pronounced and there is some callus formation.  Interval healing but not fully healed. Await formal radiology review.    Assessment and Plan: 62 y.o. female with clinically improving and radiographically improving fracture at left third toe.  However patient has developed some pain at her plantar metatarsal head that I believe is metatarsalgia.  Plan for continued buddy taping  for fracture management as well as metatarsal pads for metatarsalgia.  Recheck in 2 weeks. Metatarsalgia is new diagnosis with uncertain prognosis.     Historical information moved to improve visibility of documentation.  Past Medical History:  Diagnosis Date  . Anxiety   . Depression   . Diabetes mellitus without complication ([place])   . Diabetic peripheral neuropathy ([place])   . Parkinson's disease ([place])   . Thyroid disease    Past Surgical History:  Procedure Laterality Date  . LEFT HEART CATHETERIZATION WITH CORONARY ANGIOGRAM N/A 01/02/2015   Procedure: LEFT HEART CATHETERIZATION WITH CORONARY ANGIOGRAM;  Surgeon: Adrian Prows, MD;  Location: [facility] CATH LAB;  Service: Cardiovascular;  Laterality: N/A;  . LUNG CANCER SURGERY     Social History   Tobacco Use  . Smoking status: Former Smoker    Last attempt to quit: 06/26/2010    Years since quitting: 8.0  . Smokeless tobacco: Never Used  Substance Use Topics  . Alcohol use: No    Alcohol/week: 0.0 standard drinks   family history includes Diabetes in her sister; Heart disease in her sister; Heart failure in her mother; Non-Hodgkin's lymphoma in her father.  Medications: Current Outpatient Medications  Medication Sig Dispense Refill  . ARIPiprazole (ABILIFY) 10 MG tablet Take 15 mg by mouth daily. Reported on 11/15/2015    . aspirin 81 MG tablet Take 81 mg by mouth daily.    Marland Kitchen atorvastatin (LIPITOR) 40 MG tablet Take 40 mg by mouth daily.    . Calcium Citrate-Vitamin  D (CALCIUM + D PO) Take by mouth daily.    . carbidopa-levodopa (SINEMET IR) 25-100 MG per tablet Take 1 tablet by mouth 3 (three) times daily. Reported on 11/15/2015    . Cyanocobalamin (B-12) 2000 MCG TABS Take 1 tablet by mouth daily.    . Empagliflozin-Metformin HCl 01-999 MG TABS Take 1 tablet by mouth daily. 60 tablet   . ergocalciferol (VITAMIN D2) 50000 UNITS capsule Take 50,000 Units by mouth once a week. Saturday    . gabapentin (NEURONTIN) 100 MG capsule Take  100 mg by mouth daily.     Marland Kitchen ketoconazole (NIZORAL) 2 % shampoo Apply 1 application topically once a week.  0  . levothyroxine (SYNTHROID, LEVOTHROID) 112 MCG tablet Take 112 mcg by mouth daily before breakfast.    . LORazepam (ATIVAN) 2 MG tablet Take 2 mg by mouth at bedtime.    . metoprolol succinate (TOPROL XL) 25 MG 24 hr tablet Take 25 mg by mouth daily.    Marland Kitchen NITROSTAT 0.4 MG SL tablet Place 0.4 mg under the tongue every 5 (five) minutes x 3 doses as needed.  0  . omeprazole (PRILOSEC) 40 MG capsule Take 40 mg by mouth daily.    . rasagiline (AZILECT) 1 MG TABS tablet Take 1 mg by mouth daily. Reported on 11/15/2015    . sertraline (ZOLOFT) 100 MG tablet Take 100 mg by mouth daily.    . Suvorexant (BELSOMRA) 20 MG TABS Take by mouth at bedtime. Reported on 11/15/2015     No current facility-administered medications for this visit.    Allergies  Allergen Reactions  . Codeine   . Crestor  [Rosuvastatin]     myalgia  . Hydromorphone Hcl   . Trazodone And Nefazodone     HA, sleepy hangover      Discussed warning signs or symptoms. Please see discharge instructions. Patient expresses understanding.

## 2018-07-30 NOTE — Patient Instructions (Signed)
Thank you for coming in today. Continue buddy tape.  Use metatarsal pads in the left shoe.  Recheck in 2 weeks.  Return sooner if needed.

## 2018-07-30 NOTE — Progress Notes (Signed)
Pt requested xray before seeing Provider, toe still causing pain.

## 2018-08-02 ENCOUNTER — Other Ambulatory Visit: Payer: Self-pay | Admitting: Neurology

## 2018-08-04 ENCOUNTER — Encounter: Payer: Self-pay | Admitting: Neurology

## 2018-08-04 ENCOUNTER — Ambulatory Visit (INDEPENDENT_AMBULATORY_CARE_PROVIDER_SITE_OTHER): Admitting: Neurology

## 2018-08-04 VITALS — BP 139/75 | HR 77 | Ht 64.0 in | Wt 182.0 lb

## 2018-08-04 DIAGNOSIS — J438 Other emphysema: Secondary | ICD-10-CM

## 2018-08-04 DIAGNOSIS — G4752 REM sleep behavior disorder: Secondary | ICD-10-CM | POA: Diagnosis not present

## 2018-08-04 DIAGNOSIS — R079 Chest pain, unspecified: Secondary | ICD-10-CM | POA: Diagnosis not present

## 2018-08-04 DIAGNOSIS — J418 Mixed simple and mucopurulent chronic bronchitis: Secondary | ICD-10-CM

## 2018-08-04 DIAGNOSIS — F5105 Insomnia due to other mental disorder: Secondary | ICD-10-CM

## 2018-08-04 DIAGNOSIS — F3160 Bipolar disorder, current episode mixed, unspecified: Secondary | ICD-10-CM

## 2018-08-04 DIAGNOSIS — R0683 Snoring: Secondary | ICD-10-CM

## 2018-08-04 DIAGNOSIS — C3431 Malignant neoplasm of lower lobe, right bronchus or lung: Secondary | ICD-10-CM

## 2018-08-04 NOTE — Progress Notes (Signed)
SLEEP MEDICINE CLINIC   Provider:  Larey Seat, M D  Primary Care Physician:  Reynold Bowen, MD   Referring Provider: Reynold Bowen, MD    Chief Complaint  Patient presents with  . New Patient (Initial Visit)    pt alone, rm 10. pt states that she was having oral surgery. they recommended that she had a test completed. she states she had a sleep study around 2011, unsure who it was with. she states she did start a CPAP but immediately stopped using it due to not able to tolerate the face mask.     HPI:  Kelli Rojas is a 62 y.o. female patient, seen here as in a referral from Dr. Forde Dandy for a new sleep evaluation.   I have the pleasure of meeting Jaymes Revels today and meanwhile 62 year old Caucasian female patient of Dr. Reynold Bowen. The patient had a previous sleep study with him this [address] office in 2012,  but apparently before the  epic medical record system was instated. .  In the meantime she has developed some new diagnosis and medical conditions that make it necessary to reevaluate the presence of sleep apnea.    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  She has a history of kidney stones had at one time a urostomy placed, she has a history of achalasia as a symptom, gastroesophageal reflux disease, bipolar depression, in 2016 she was diagnosed with coronary artery disease the LAD had a 10% stenosis the septal perforator has had 77 to 80% stenosis at the ostium, the major epicardial vessels were not affected.  She has regular colonoscopies all normal, she has cervical spondylosis which partially is age attributable.    Chief complaint according to patient : " I have no concern about my sleep" .  The patient had oral surgery earlier this summer and was told that she has a very narrow airway, it was difficult for her dentist to work on the molar teeth.  The procedure was done at the  [facility]ital  With anesthsia- dental  surgeon  Dr . Jamison Oka concerned about apnea post op. Her husband has reported that she snores.   Sleep habits are as follows: Patient's dinnertime is usually 7 PM, she spends the evening hours at home usually sedate with her husband watching TV reading talking etc.  Bedtime for the couple is around 10 PM, the bedroom is cool quiet and dark.  The patient prefers to sleep prone, using one pillow only.  The bed is not adjustable and has a flat surface. The patient reports that she struggles with chronic insomnia and takes lorazepam and temazepam at night temazepam 30 mg and lorazepam 2 mg.  After she takes her medications she has no trouble to initiate sleep.she sleeps through for 7-8 hours and wakes refreshed, restored. If not on medication, she cannot sleep , wide awake , and has been awake for 48 hours once.  She reports not dreaming. No sleep walking, snoring, but husband reported no apnea being witnessed. Sometimes headaches when waking up. Dry mouth. On pilocarpine for dry mouth.     Sleep medical history : she denies having any  depression ( but she cries a lot ) , has anxiety.  Chronic insomnia related to anxiety. weight gain, lung cancer with mild CODP, CAD, has DM , obesity- gained 60 pounds over a period of 4 years. No sleep walking, snoring, but  husband reported no apnea being witnessed. Sometimes headaches when waking up. Dry mouth.  Hypothyroidism.  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   The patient's family history is strongly positive for cardiovascular disease ( high cholesterol)  and diabetes mellitus.  She therefore has followed up with cardiology.  Further health history see above.   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 HS graduate,   married, without children, 3 cats, coffee 2 cups, no sodas, no iced tea,  Etoh - none, tobacco use - none since 10/ 2011.    Review of Systems: Out of a  complete 14 system review, the patient complains of only the following symptoms, and all other reviewed systems are negative.Insomnia, cannot go to sleep, not having  difficulties staying asleep- this is not typical for apnea. More likely psychological causes. Snoring. Obesity, high anxiety, chronic benzodiazepine use- prescriber Samara Deist  .   Epworth sleepiness score 3/ 24   No naps, no sleep attacks. , Fatigue severity score 31 , depression score  4/ 15.   Social History   Socioeconomic History  . Marital status: Married    Spouse name: Not on file  . Number of children: Not on file  . Years of education: Not on file  . Highest education level: Not on file  Occupational History  . Not on file  Social Needs  . Financial resource strain: Not on file  . Food insecurity:    Worry: Not on file    Inability: Not on file  . Transportation needs:    Medical: Not on file    Non-medical: Not on file  Tobacco Use  . Smoking status: Former Smoker    Last attempt to quit: 06/26/2010    Years since quitting: 8.1  . Smokeless tobacco: Never Used  Substance and Sexual Activity  . Alcohol use: No    Alcohol/week: 0.0 standard drinks  . Drug use: No  . Sexual activity: Not on file  Lifestyle  . Physical activity:    Days per week: Not on file    Minutes per session: Not on file  . Stress: Not on file  Relationships  . Social connections:    Talks on phone: Not on file    Gets together: Not on file    Attends religious service: Not on file    Active member of club or organization: Not on file    Attends meetings of clubs or organizations: Not on file    Relationship status: Not on file  . Intimate partner violence:    Fear of current or ex partner: Not on file    Emotionally abused: Not on file    Physically abused: Not on file    Forced sexual activity: Not on file  Other Topics Concern  . Not on file  Social History Narrative  . Not on file    Family History  Problem  Relation Age of Onset  . Heart failure Mother   . Non-Hodgkin's lymphoma Father   . Diabetes Sister   . Heart disease Sister     Past Medical History:  Diagnosis Date  . Anxiety   . Cancer ([place])    lung  . Depression   . Diabetes mellitus without complication ([place])   . Diabetic peripheral neuropathy ([place])   . High cholesterol   . Parkinson's disease ([place])   . Thyroid disease     Past Surgical History:  Procedure Laterality Date  .  LEFT HEART CATHETERIZATION WITH CORONARY ANGIOGRAM N/A 01/02/2015   Procedure: LEFT HEART CATHETERIZATION WITH CORONARY ANGIOGRAM;  Surgeon: Adrian Prows, MD;  Location[facility] CATH LAB;  Service: Cardiovascular;  Laterality: N/A;  . LUNG CANCER SURGERY    . MOUTH SURGERY  03/22/2018    Current Outpatient Medications  Medication Sig Dispense Refill  . aspirin 81 MG tablet Take 81 mg by mouth daily.    Marland Kitchen atorvastatin (LIPITOR) 40 MG tablet Take 40 mg by mouth daily.    . Calcium Carbonate-Vit D-Min (CALCIUM 1200 PO) Take 1 tablet by mouth daily.    . Cyanocobalamin (B-12) 1000 MCG TABS Take 1 tablet by mouth daily.     Marland Kitchen gabapentin (NEURONTIN) 100 MG capsule Take 100 mg by mouth daily.     Marland Kitchen JARDIANCE 25 MG TABS tablet     . ketoconazole (NIZORAL) 2 % shampoo Apply 1 application topically once a week.  0  . LANTUS SOLOSTAR 100 UNIT/ML Solostar Pen 20 Units every morning.    Marland Kitchen LORazepam (ATIVAN) 2 MG tablet Take 2 mg by mouth at bedtime.    . metFORMIN (GLUCOPHAGE-XR) 500 MG 24 hr tablet     . metoprolol succinate (TOPROL XL) 25 MG 24 hr tablet Take 25 mg by mouth daily.    Marland Kitchen NITROSTAT 0.4 MG SL tablet Place 0.4 mg under the tongue every 5 (five) minutes x 3 doses as needed.  0  . Omega-3 Fatty Acids (FISH OIL) 1000 MG CAPS Take 1,000 mg by mouth daily.    Marland Kitchen omeprazole (PRILOSEC) 40 MG capsule Take 40 mg by mouth daily.    . pilocarpine (SALAGEN) 5 MG tablet Take by mouth 2 (two) times daily.    . temazepam (RESTORIL) 30 MG capsule Take 30 mg by mouth at  bedtime as needed for sleep.    Marland Kitchen VITAMIN D PO Take 1,200 mg by mouth 2 (two) times daily.     No current facility-administered medications for this visit.     Allergies as of 08/04/2018 - Review Complete 08/04/2018  Allergen Reaction Noted  . Codeine    . Crestor  [rosuvastatin]  06/27/2015  . Hydromorphone hcl    . Trazodone and nefazodone  06/27/2015    Vitals: BP 139/75   Pulse 77   Ht 5\' 4"  (1.626 m)   Wt 182 lb (82.6 kg)   BMI 31.24 kg/m  Last Weight:  Wt Readings from Last 1 Encounters:  08/04/18 182 lb (82.6 kg)   JKD:TOIZ mass index is 31.24 kg/m.     Last Height:   Ht Readings from Last 1 Encounters:  08/04/18 5\' 4"  (1.626 m)    Physical exam:  General: The patient is awake, alert and appears not in acute distress. The patient is well groomed. Head: Normocephalic, atraumatic. Neck is supple. Mallampati 3 ,  neck circumference: 15. 75,  Nasal airflow patent , TMJ click is evident . Retrognathia is not seen.  Cardiovascular:  Regular rate and rhythm , without  murmurs or carotid bruit, and without distended neck veins. Respiratory: Lungs are clear to auscultation. Skin:  Without evidence of edema, or rash Trunk: BMI is* 31.2.  The patient's posture is erect/  Neurologic exam : The patient is awake and alert, oriented to place and time.   Memory  MOCA:No flowsheet data found. MMSE:No flowsheet data found.  Attention span & concentration ability appears normal.  Speech is fluent,  withdysarthria, dysphonia .  Mood and affect are appropriate.  Cranial nerves:  Pupils are equal and briskly reactive to light. Funduscopic exam without evidence of pallor or edema. Extraocular movements  in vertical and horizontal planes intact and without nystagmus. Visual fields by finger perimetry are intact. Hearing to finger rub intact.   Facial sensation intact to fine touch.  Facial motor strength is symmetric and tongue and uvula move midline. Shoulder shrug was  symmetrical.   Motor exam:  Normal tone, muscle bulk and symmetric strength in all extremities. Sensory:  Fine touch, pinprick and vibration intact in both hands - Proprioception tested in the upper extremities was normal. Coordination: Rapid alternating movements /Finger-to-nose maneuver  normal without evidence of ataxia, dysmetria and NO tremor. Penmanship unchanged!  Gait and station: Patient walks without assistive device. Stance is stable and normal. Toe stand deferred - she had a fracture of the left foot middle toe in September - Tandem gait deferred . Turns with 4 Steps, slows down. Romberg testing is negative. Deep tendon reflexes: in the upper and lower extremities are symmetric and intact.   Assessment:  After physical and neurologic examination, review of laboratory studies,  Personal review of imaging studies, reports of other /same  Imaging studies, results of polysomnography and / or neurophysiology testing and pre-existing records as far as provided in visit., my assessment is:    1)  Patient may have OSA, but she does not believe it. She describes RLS and REM BD- sleep kicking, yelling but never leaves the bed.   2) obesity-This  is likely cause for snoring and snoring begets dry mouth - weight loss is often difficult on psychotropic medications. Consider medical weight management, PCP  Dr Forde Dandy.   3) RLS - will ask her PCP to check ferritin, TIBC- NP Dorethea Clan  has prescribed gabapentin at  200 mg nightly controls the symptoms.  4) REM BD .   The patient was advised of the nature of the diagnosed disorder , the treatment options and the  risks for general health and wellness arising from not treating the condition.   I spent more than 55.0 minutes of face to face time with the patient.  Greater than 50% of time was spent in counseling and coordination of care. We have discussed the diagnosis and differential and I answered the patient's questions.    Plan:  Treatment plan and  additional workup : screenig test for sleep apnea. Attended Sleep REM BD for a patient that has had lung cancer, COPD and is snoring.  Bipolar . study per [employer] criteria. SPLIT at AHI 20.    Sleep Clinic follow up only - if positive for sleep apnea.   No follow-ups on file. I will order a sleep test to evaluate the presence of organic sleep disorders, RLS and OSA, but I doubt I can help her with the psychological / psychiatric origin of her Insomnia.  I will not prescribe temazepam or benzodiazepine for Insomnia for the patient. PS:  primary neurologist is Dr. Carles Collet. Please evaluate for bulbar speech - reports tearful outbreaks without feeling depressed. Pseudobulbar ?    CC Roque Cash., MD  CC Noemi Chapel , NP  Larey Seat, MD 25/02/3892, 73:42 PM  Certified in Neurology by ABPN Certified in Sleep Medicine by [facility] [address] [place], [place] [address]

## 2018-08-13 ENCOUNTER — Ambulatory Visit (INDEPENDENT_AMBULATORY_CARE_PROVIDER_SITE_OTHER)

## 2018-08-13 ENCOUNTER — Encounter: Payer: Self-pay | Admitting: Family Medicine

## 2018-08-13 ENCOUNTER — Ambulatory Visit (INDEPENDENT_AMBULATORY_CARE_PROVIDER_SITE_OTHER): Admitting: Family Medicine

## 2018-08-13 ENCOUNTER — Other Ambulatory Visit: Payer: Self-pay | Admitting: Family Medicine

## 2018-08-13 VITALS — BP 139/69 | HR 79 | Ht 64.0 in | Wt 180.0 lb

## 2018-08-13 DIAGNOSIS — S92515D Nondisplaced fracture of proximal phalanx of left lesser toe(s), subsequent encounter for fracture with routine healing: Secondary | ICD-10-CM | POA: Diagnosis not present

## 2018-08-13 DIAGNOSIS — S92515A Nondisplaced fracture of proximal phalanx of left lesser toe(s), initial encounter for closed fracture: Secondary | ICD-10-CM

## 2018-08-13 DIAGNOSIS — X58XXXD Exposure to other specified factors, subsequent encounter: Secondary | ICD-10-CM | POA: Diagnosis not present

## 2018-08-13 NOTE — Progress Notes (Signed)
Kelli Rojas is a 62 y.o. female who presents to [facility] today for left third proximal phalanx fracture follow-up.  Patient suffered a fracture about 6 weeks ago.  She is doing quite well clinically with no significant pain.  She is no longer is doing much buddy taping.  She is able to walk normally using a normal shoe.    ROS:  As above  Exam:  BP 139/69   Pulse 79   Ht 5\' 4"  (1.626 m)   Wt 180 lb (81.6 kg)   BMI 30.90 kg/m  General: Well Developed, well nourished, and in no acute distress.  Neuro/Psych: Alert and oriented x3, extra-ocular muscles intact, able to move all 4 extremities, sensation grossly intact. Skin: Warm and dry, no rashes noted.  Respiratory: Not using accessory muscles, speaking in full sentences, trachea midline.  Cardiovascular: Pulses palpable, no extremity edema. Abdomen: Does not appear distended. MSK:  Left foot relatively normal-appearing minimal swelling left third toe.  Nontender normal motion capillary refill and sensation are intact distally.  Pulses are intact in the foot.    Lab and Radiology Results X-ray images left third toe personally independently reviewed. Continue comminuted fracture with slight healing since interval no change in alignment or displacement. Await formal radiology review.    Assessment and Plan: 62 y.o. female with left third toe fracture clinically doing extremely well still healing to go on x-ray.  Plan to continue current regimen buddy taping as needed.  Recheck in 1 month return sooner if needed.    No orders of the defined types were placed in this encounter.  No orders of the defined types were placed in this encounter.   Historical information moved to improve visibility of documentation.  Past Medical History:  Diagnosis Date  . Anxiety   . Cancer ([place])    lung  . Depression   . Diabetes mellitus without complication ([place])   . Diabetic peripheral  neuropathy ([place])   . High cholesterol   . Parkinson's disease ([place])   . Thyroid disease    Past Surgical History:  Procedure Laterality Date  . LEFT HEART CATHETERIZATION WITH CORONARY ANGIOGRAM N/A 01/02/2015   Procedure: LEFT HEART CATHETERIZATION WITH CORONARY ANGIOGRAM;  Surgeon: Adrian Prows, MD;  Location: [facility] CATH LAB;  Service: Cardiovascular;  Laterality: N/A;  . LUNG CANCER SURGERY    . MOUTH SURGERY  03/22/2018   Social History   Tobacco Use  . Smoking status: Former Smoker    Last attempt to quit: 06/26/2010    Years since quitting: 8.1  . Smokeless tobacco: Never Used  Substance Use Topics  . Alcohol use: No    Alcohol/week: 0.0 standard drinks   family history includes Diabetes in her sister; Heart disease in her sister; Heart failure in her mother; Non-Hodgkin's lymphoma in her father.  Medications: Current Outpatient Medications  Medication Sig Dispense Refill  . aspirin 81 MG tablet Take 81 mg by mouth daily.    Marland Kitchen atorvastatin (LIPITOR) 40 MG tablet Take 40 mg by mouth daily.    . Calcium Carbonate-Vit D-Min (CALCIUM 1200 PO) Take 1 tablet by mouth daily.    . Cyanocobalamin (B-12) 1000 MCG TABS Take 1 tablet by mouth daily.     Marland Kitchen gabapentin (NEURONTIN) 100 MG capsule Take 100 mg by mouth daily.     Marland Kitchen JARDIANCE 25 MG TABS tablet     . ketoconazole (NIZORAL) 2 % shampoo Apply 1 application topically once a  week.  0  . LANTUS SOLOSTAR 100 UNIT/ML Solostar Pen 20 Units every morning.    Marland Kitchen LORazepam (ATIVAN) 2 MG tablet Take 2 mg by mouth at bedtime.    . metFORMIN (GLUCOPHAGE-XR) 500 MG 24 hr tablet     . metoprolol succinate (TOPROL XL) 25 MG 24 hr tablet Take 25 mg by mouth daily.    Marland Kitchen NITROSTAT 0.4 MG SL tablet Place 0.4 mg under the tongue every 5 (five) minutes x 3 doses as needed.  0  . Omega-3 Fatty Acids (FISH OIL) 1000 MG CAPS Take 1,000 mg by mouth daily.    Marland Kitchen omeprazole (PRILOSEC) 40 MG capsule Take 40 mg by mouth daily.    . pilocarpine (SALAGEN) 5 MG  tablet Take by mouth 2 (two) times daily.    . temazepam (RESTORIL) 30 MG capsule Take 30 mg by mouth at bedtime as needed for sleep.    Marland Kitchen VITAMIN D PO Take 1,200 mg by mouth 2 (two) times daily.     No current facility-administered medications for this visit.    Allergies  Allergen Reactions  . Codeine   . Crestor  [Rosuvastatin]     myalgia  . Hydromorphone Hcl   . Trazodone And Nefazodone     HA, sleepy hangover      Discussed warning signs or symptoms. Please see discharge instructions. Patient expresses understanding.

## 2018-08-13 NOTE — Patient Instructions (Signed)
Thank you for coming in today. Recheck in 1 month.  Return sooner if needed.

## 2018-08-13 NOTE — Progress Notes (Signed)
Xray order placed per last OV note with treating Provider.

## 2018-08-23 ENCOUNTER — Ambulatory Visit (INDEPENDENT_AMBULATORY_CARE_PROVIDER_SITE_OTHER): Admitting: Neurology

## 2018-08-23 DIAGNOSIS — C3431 Malignant neoplasm of lower lobe, right bronchus or lung: Secondary | ICD-10-CM

## 2018-08-23 DIAGNOSIS — J418 Mixed simple and mucopurulent chronic bronchitis: Secondary | ICD-10-CM

## 2018-08-23 DIAGNOSIS — J449 Chronic obstructive pulmonary disease, unspecified: Principal | ICD-10-CM

## 2018-08-23 DIAGNOSIS — R079 Chest pain, unspecified: Secondary | ICD-10-CM

## 2018-08-23 DIAGNOSIS — R0683 Snoring: Secondary | ICD-10-CM

## 2018-08-23 DIAGNOSIS — G4752 REM sleep behavior disorder: Secondary | ICD-10-CM

## 2018-08-23 DIAGNOSIS — G4733 Obstructive sleep apnea (adult) (pediatric): Secondary | ICD-10-CM | POA: Diagnosis not present

## 2018-08-23 DIAGNOSIS — F3160 Bipolar disorder, current episode mixed, unspecified: Secondary | ICD-10-CM

## 2018-08-23 DIAGNOSIS — J438 Other emphysema: Secondary | ICD-10-CM

## 2018-09-03 NOTE — Addendum Note (Signed)
Addended by: Larey Seat on: 09/03/2018 11:09 AM   Modules accepted: Orders

## 2018-09-03 NOTE — Procedures (Signed)
PATIENT'S NAME:  Kelli, Rojas DOB:      [birth year]      MR#:    [id number]     DATE OF RECORDING: 08/23/2018 REFERRING M.D.:  Reynold Bowen, MD Study Performed:   Baseline Polysomnogram HISTORY:  Kelli Rojas is a 62 y.o. female patient, seen here as in a referral from Dr. Forde Dandy for a new sleep evaluation in a chronic insomnia patient.  Chief complaint according to patient: "I have no concern about my sleep".  The patient had oral surgery earlier this summer and was told that she has a very narrow airway, it was difficult for her dentist to work on the molar teeth.  The procedure was done at the hospital with anesthesia- dental surgeon. Dr. Jamison Oka concerned about apnea post op. Her husband has reported that she snores.    I have the pleasure of meeting Kelli Rojas today and meanwhile Kelli Rojas. The patient had a previous sleep study within this [facility] office in 2012. In the meantime she has developed some new diagnosis and medical conditions that make it necessary to reevaluate the presence of sleep apnea. The patient has a history of lung cancer in 2011, pneumonia in 2013, history of kidney stones, she has a history of achalasia and gastroesophageal reflux disease, bipolar depression, in 2016 she was diagnosed with coronary artery disease the LAD had a 10% stenosis the septal perforator has had 70 to 80% stenosis at the ostium. Patient has anxiety= Chronic insomnia related to anxiety. Lung cancer with mild CODP, CAD, has DM, obesity- gained 60 pounds over a period of 4 years. No sleep walking, snoring, but husband reported no apnea being witnessed. Sometimes headaches when waking up. Dry mouth.  Hypothyroidism. 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  The patient endorsed the Epworth Sleepiness Scale at 3 points. The patient's weight 183 pounds with a height of 64 (inches), resulting in a BMI of 31.2 kg/m2.The patient's neck  circumference measured 15 inches.  CURRENT MEDICATIONS: Aspirin, Lipitor, Neurontin, Jardiance, Nizoral, Ativan, Metformin, Prilosec, Salagen, Restoril   PROCEDURE:  This is a multichannel digital polysomnogram utilizing the Somnostar 11.2 system.  Electrodes and sensors were applied and monitored per AASM Specifications.   EEG, EOG, Chin and Limb EMG, were sampled at 200 Hz.  ECG, Snore and Nasal Pressure, Thermal Airflow, Respiratory Effort, CPAP Flow and Pressure, Oximetry was sampled at 50 Hz. Digital video and audio were recorded.      BASELINE STUDY: Lights Out was at 22:59 and Lights On at 05:00.  Total recording time (TRT) was 361.5 minutes, with a total sleep time (TST) of 164 minutes.   The patient's sleep latency was 104.5 minutes.  REM latency was 227.5 minutes.  The sleep efficiency was 45.4 %.     SLEEP ARCHITECTURE: WASO (Wake after sleep onset) was 132.5 minutes.  There were 33.5 minutes in Stage N1, 31.5 minutes Stage N2, 79.5 minutes Stage N3 and 19.5 minutes in Stage REM.  The percentage of Stage N1 was 20.4%, Stage N2 was 19.2%, Stage N3 was 48.5% and Stage R (REM sleep) was 11.9%.     RESPIRATORY ANALYSIS:  There were a total of 40 respiratory events:  3 obstructive apneas, 0 central apneas and 0 mixed apneas with a total of 3 apneas and an apnea index (AI) of 1.1 /hour. There were 37 hypopneas with a hypopnea index of 13.5 /hour. The patient also had 1 respiratory event related  arousal (RERAs).     The total APNEA/HYPOPNEA INDEX (AHI) was 14.6 /hour and the total RESPIRATORY DISTURBANCE INDEX was 15.0 /hour.  8 events occurred in REM sleep and Kelli events in NREM. The REM AHI was 24.6 /hour, versus a non-REM AHI of 13.3. The patient spent 86.5 minutes of total sleep time in the supine position and 78 minutes in non-supine. The supine AHI was 13.2/h versus a non-supine AHI of 16.3/h.  OXYGEN SATURATION & C02:  The Wake baseline 02 saturation was 96%, with the lowest being 84%. Time  spent below 89% saturation equaled 21 minutes.   PERIODIC LIMB MOVEMENTS:   The patient had a total of 190 Periodic Limb Movements.  The Periodic Limb Movement (PLM) index was 69.5 and the PLM Arousal index was 2.6/ hour. The arousals were noted as: 45 were spontaneous, 7 were associated with PLMs, and 30 were associated with respiratory events.  Audio and video analysis did not show any abnormal or unusual movements, behaviors, phonations or vocalizations.   Snoring was noted. EKG was in keeping with normal sinus rhythm (NSR). Post-study, the patient indicated that sleep was not representative in the lab environment.   IMPRESSION:  1. Severely prolonged latency related to environmental factors.  2. Mild Obstructive Sleep Apnea(OSA) 3. Periodic Limb Movement Disorder (PLMD) 4. Loud Snoring  RECOMMENDATIONS: There was no prolonged oxygen desaturation associated with this patients Apnea. REM sleep increased the AHI, and REM dependent sleep apnea is considered poorly responsive to dental device treatment.   The patient has a choice of autotitration at home or attended titration in lab with premedication. Weight loss is recommended as it decreased snoring and apnea. PLMs were not causing many arousals form sleep, but may influence the sleep latency, thus reducing the TST.    1. Advise full-night, attended, CPAP titration study to optimize therapy.     I certify that I have reviewed the entire raw data recording prior to the issuance of this report in accordance with the Standards of Accreditation of the American Academy of Sleep Medicine (AASM)    Larey Seat, MD    09-02-2018  Diplomat, American Board of Psychiatry and Neurology  Diplomat, American Board of [place] Director, [facility] [employer]

## 2018-09-06 ENCOUNTER — Other Ambulatory Visit: Payer: Self-pay | Admitting: Neurology

## 2018-09-06 ENCOUNTER — Telehealth: Payer: Self-pay | Admitting: Neurology

## 2018-09-06 DIAGNOSIS — G4730 Sleep apnea, unspecified: Secondary | ICD-10-CM

## 2018-09-06 DIAGNOSIS — R0683 Snoring: Secondary | ICD-10-CM

## 2018-09-06 NOTE — Telephone Encounter (Signed)
-----   Message from Larey Seat, MD sent at 09/03/2018 11:09 AM EST ----- IMPRESSION:  1. Severely prolonged latency related to environmental factors.  2. Mild Obstructive Sleep Apnea(OSA) 3. Periodic Limb Movement Disorder (PLMD) 4. Loud Snoring  RECOMMENDATIONS: There was no prolonged oxygen desaturation  associated with this patients Apnea. REM sleep increased the AHI,  and REM dependent sleep apnea is considered poorly responsive to  dental device treatment. ( RN - please send note to Dr Jamison Oka, DDS, please)  The patient has a choice of autotitration at home or attended titration in lab with premedication (my preference) . Weight loss is recommended as it decreases snoring and apnea. PLMs were not causing many arousals from sleep, but may influence the sleep latency, thus reducing the TST ( total sleep time).

## 2018-09-06 NOTE — Telephone Encounter (Signed)
I called pt. I advised pt that Dr. Brett Fairy reviewed their sleep study results and found that pt has mild sleep apnea. Dr. Brett Fairy recommends that pt starts auto CPAP. I reviewed PAP compliance expectations with the pt. Pt is agreeable to starting a CPAP. I advised pt that an order will be sent to a DME, [facility], and [facility] will call the pt within about one week after they file with the pt's insurance. [facility] will show the pt how to use the machine, fit for masks, and troubleshoot the CPAP if needed. A follow up appt was made for insurance purposes with Cecille Rubin, NP on Feb 20,2020 at 8:15 am. Pt verbalized understanding to arrive 15 minutes early and bring their CPAP. A letter with all of this information in it will be mailed to the pt as a reminder. I verified with the pt that the address we have on file is correct. Pt verbalized understanding of results. Pt had no questions at this time but was encouraged to call back if questions arise. I have sent the order to [facility] and have received confirmation that they have received the order.

## 2018-09-14 ENCOUNTER — Ambulatory Visit: Admitting: Family Medicine

## 2018-09-30 ENCOUNTER — Encounter: Payer: Self-pay | Admitting: Registered"

## 2018-09-30 ENCOUNTER — Encounter: Attending: Endocrinology | Admitting: Registered"

## 2018-09-30 DIAGNOSIS — Z794 Long term (current) use of insulin: Secondary | ICD-10-CM

## 2018-09-30 DIAGNOSIS — E119 Type 2 diabetes mellitus without complications: Secondary | ICD-10-CM | POA: Diagnosis not present

## 2018-09-30 NOTE — Progress Notes (Signed)
Diabetes Self-Management Education  Visit Type: First/Initial  Appt. Start Time: 1045 Appt. End Time: 1200  09/30/2018  Ms. [employer], identified by name and date of birth, is a 63 y.o. female with a diagnosis of Diabetes: Type 2.   ASSESSMENT Pt states she wants to take care of herself and is afraid because her family has all passed away from diabetes related illnesses, most at young ages. Pt states cooking dinner is hard because her husband only wants to eat red meat but has said that he is willing to follow what she has to do to be healthy. Pt reports that both she and her husband are retired. Pt states she wants ideas for meal planning.  Pt states concern that she always wants sweets and her husband will ask if she wants anything when he goes out and she will request things like milkshakes or other sweet foods and feels badly about this. Pt states she has been told to not eat starches.   Pt reports she has lost weight recently, was a size 16 and now size 12. Pt states in the 80s she lost a lot of weight, down to a size 2 when following a liquid diet.   Pt states she was instructed to check BG 2x/day fasting and 2 hrs PPBG. Pt states she only gathers this data for MD and does not know what the numbers mean. Pt states she usually forgets to take BG during the day. Pt states once when she forgot to take insulin her BG went up to 250 mg/dL and states she felt like she was going down hill fast, took insulin but still was afraid and went to ER where they told her it wasn't that high, but treated her with insulin and IV fluids and she reports she felt much better.  Relevant PMH: Parkinsons, Thyroid, neorpathy, lung cancer, anxiety, high cholesterol 115-160  Activity: Pt states she was going to [employer] 3x week and really enjoys it, but stopped in October when she broke her toe. Pt states her toe still hurts some, but plans to get back into exercise starting next week. Sleep 6-8 hrs, feels  rested Social: pt states spouse is best friend, no other social support.  Diabetes Self-Management Education - 09/30/18 1054      Visit Information   Visit Type  First/Initial      Initial Visit   Diabetes Type  Type 2    Are you currently following a meal plan?  No    Are you taking your medications as prescribed?  Yes   jardiance, lantus, metformin   Date Diagnosed  2014      Health Coping   How would you rate your overall health?  Good      Psychosocial Assessment   Patient Belief/Attitude about Diabetes  Motivated to manage diabetes    How often do you need to have someone help you when you read instructions, pamphlets, or other written materials from your doctor or pharmacy?  1 - Never    What is the last grade level you completed in school?  12      Complications   Last HgB A1C per patient/outside source  6.7 %   per referral lab   How often do you check your blood sugar?  1-2 times/day    Fasting Blood glucose range (mg/dL)  70-129;130-179   115-160   Have you had a dilated eye exam in the past 12 months?  Yes  Have you had a dental exam in the past 12 months?  Yes    Are you checking your feet?  No      Dietary Intake   Breakfast  cheerios OR none doesn't get hungry for about 2 hrs    Snack (morning)  none    Lunch  none    Snack (afternoon)  cheetos of fritos OR yogurt parfait    Dinner  rice & gravey OR green beans, hamburger steak OR spaghetti OR tator tot casserole OR bacon & eggs, grits    Snack (evening)  sweet    Beverage(s)  water, 2 c coffee with cream      Exercise   Exercise Type  ADL's    How many days per week to you exercise?  0    How many minutes per day do you exercise?  0    Total minutes per week of exercise  0      Patient Education   Previous Diabetes Education  No    Nutrition management   Role of diet in the treatment of diabetes and the relationship between the three main macronutrients and blood glucose level    Physical activity  and exercise   Role of exercise on diabetes management, blood pressure control and cardiac health.    Monitoring  Purpose and frequency of SMBG.    Psychosocial adjustment  Other (comment)   expand social connections     Individualized Goals (developed by patient)   Nutrition  General guidelines for healthy choices and portions discussed    Physical Activity  Exercise 3-5 times per week    Monitoring   test my blood glucose as discussed      Outcomes   Expected Outcomes  Demonstrated interest in learning. Expect positive outcomes    Future DMSE  2 months    Program Status  Not Completed      Individualized Plan for Diabetes Self-Management Training:   Learning Objective:  Patient will have a greater understanding of diabetes self-management. Patient education plan is to attend individual and/or group sessions per assessed needs and concerns.   Patient Instructions  To help reduce sweet cravings try eating balanced meals and snacks through out the day. Consider having more protein with breakfast Consider the snacks sheet for ideas for your afternoon snack, also if you like yogurt parfait, try making your own with low sugar Greek yogurt and fresh berries. For dinner try more recipes in your crockpot Consider using [place] burger mixed with ground beef to help get your husband used to the flavor of poultry. Consider ways to add more beans into your diet If you are wondering if a meal has too many carbs you can check your blood sugar 2 hours after a meal. Continue with your plan to get back into silver sneakers and including daily movement.  Expected Outcomes:  Demonstrated interest in learning. Expect positive outcomes  Education material provided: A1C conversion sheet, My Plate and Snack sheet  If problems or questions, patient to contact team via:  Phone  Future DSME appointment: 2 months

## 2018-09-30 NOTE — Patient Instructions (Addendum)
To help reduce sweet cravings try eating balanced meals and snacks through out the day. Consider having more protein with breakfast Consider the snacks sheet for ideas for your afternoon snack, also if you like yogurt parfait, try making your own with low sugar Greek yogurt and fresh berries. For dinner try more recipes in your crockpot Consider using [place] burger mixed with ground beef to help get your husband used to the flavor of poultry. Consider ways to add more beans into your diet If you are wondering if a meal has too many carbs you can check your blood sugar 2 hours after a meal. Continue with your plan to get back into silver sneakers and including daily movement.

## 2018-10-13 DIAGNOSIS — K117 Disturbances of salivary secretion: Secondary | ICD-10-CM | POA: Insufficient documentation

## 2018-11-18 ENCOUNTER — Ambulatory Visit: Payer: Self-pay | Admitting: Nurse Practitioner

## 2018-11-21 ENCOUNTER — Other Ambulatory Visit: Payer: Self-pay | Admitting: Cardiology

## 2018-11-23 DIAGNOSIS — R1319 Other dysphagia: Secondary | ICD-10-CM | POA: Insufficient documentation

## 2018-11-25 ENCOUNTER — Other Ambulatory Visit: Payer: Self-pay | Admitting: Cardiology

## 2018-11-30 ENCOUNTER — Ambulatory Visit: Admitting: Registered"

## 2019-01-05 ENCOUNTER — Encounter: Payer: Self-pay | Admitting: Cardiology

## 2019-01-05 ENCOUNTER — Ambulatory Visit (INDEPENDENT_AMBULATORY_CARE_PROVIDER_SITE_OTHER): Admitting: Cardiology

## 2019-01-05 ENCOUNTER — Other Ambulatory Visit: Payer: Self-pay

## 2019-01-05 VITALS — BP 110/70 | Ht 63.5 in | Wt 179.0 lb

## 2019-01-05 DIAGNOSIS — I2510 Atherosclerotic heart disease of native coronary artery without angina pectoris: Secondary | ICD-10-CM

## 2019-01-05 DIAGNOSIS — Z794 Long term (current) use of insulin: Secondary | ICD-10-CM | POA: Diagnosis not present

## 2019-01-05 DIAGNOSIS — E119 Type 2 diabetes mellitus without complications: Secondary | ICD-10-CM | POA: Diagnosis not present

## 2019-01-05 NOTE — Progress Notes (Signed)
Virtual Visit via Video Note   Subjective:   Kelli Rojas, female    DOB: September 19, 1956, 63 y.o.   MRN: [id number]   I connected with the patient on 01/05/19 by a video enabled telemedicine application and verified that I am speaking with the correct person using two identifiers.     I discussed the limitations of evaluation and management by telemedicine and the availability of in person appointments. The patient expressed understanding and agreed to proceed.   This visit type was conducted due to national recommendations for restrictions regarding the COVID-19 Pandemic (e.g. social distancing).  This format is felt to be most appropriate for this patient at this time.  All issues noted in this document were discussed and addressed.  No physical exam was performed (except for noted visual exam findings with Tele health visits).  The patient has consented to conduct a Tele health visit and understands insurance will be billed.     Chief complaint:  Coronary artery disease  HPI  63 year old Caucasian female with mild nonobstructive coronary artery disease, type II diabetes mellitus, hyperlipidemia, strong family history of premature CAD, former smoker, lung cancer status post right lobectomy.  This is 1 year follow up visit. Patient denies chest pain, shortness of breath, palpitations, leg edema, orthopnea, PND, TIA/syncope. She is not doing any regular physical activity. Her fastin BG is running 140 to 160. She has follow up with Dr. Baldwin Crown office next week.  BG140-160  Lantus 20-22 last visit with Dr. Forde Dandy. Sees Oren Section MD 04/16.   Past Medical History:  Diagnosis Date  . Anxiety   . Cancer ([place])    lung  . Depression   . Diabetes mellitus without complication ([place])   . Diabetic peripheral neuropathy ([place])   . High cholesterol   . Parkinson's disease ([place])   . Thyroid disease      Past Surgical History:  Procedure Laterality Date  . LEFT HEART CATHETERIZATION WITH  CORONARY ANGIOGRAM N/A 01/02/2015   Procedure: LEFT HEART CATHETERIZATION WITH CORONARY ANGIOGRAM;  Surgeon: Adrian Prows, MD;  Location: [facility] CATH LAB;  Service: Cardiovascular;  Laterality: N/A;  . LUNG CANCER SURGERY    . MOUTH SURGERY  03/22/2018     Social History   Socioeconomic History  . Marital status: Married    Spouse name: Not on file  . Number of children: Not on file  . Years of education: Not on file  . Highest education level: Not on file  Occupational History  . Not on file  Social Needs  . Financial resource strain: Not on file  . Food insecurity:    Worry: Not on file    Inability: Not on file  . Transportation needs:    Medical: Not on file    Non-medical: Not on file  Tobacco Use  . Smoking status: Former Smoker    Last attempt to quit: 06/26/2010    Years since quitting: 8.5  . Smokeless tobacco: Never Used  Substance and Sexual Activity  . Alcohol use: No    Alcohol/week: 0.0 standard drinks  . Drug use: No  . Sexual activity: Not on file  Lifestyle  . Physical activity:    Days per week: Not on file    Minutes per session: Not on file  . Stress: Not on file  Relationships  . Social connections:    Talks on phone: Not on file    Gets together: Not on file    Attends religious service:  Not on file    Active member of club or organization: Not on file    Attends meetings of clubs or organizations: Not on file    Relationship status: Not on file  . Intimate partner violence:    Fear of current or ex partner: Not on file    Emotionally abused: Not on file    Physically abused: Not on file    Forced sexual activity: Not on file  Other Topics Concern  . Not on file  Social History Narrative  . Not on file     Current Outpatient Medications on File Prior to Visit  Medication Sig Dispense Refill  . aspirin 81 MG tablet Take 81 mg by mouth daily.    Marland Kitchen atorvastatin (LIPITOR) 40 MG tablet TAKE 1 TABLET DAILY 90 tablet 4  . Calcium Carbonate-Vit  D-Min (CALCIUM 1200 PO) Take 1 tablet by mouth daily.    . Cyanocobalamin (B-12) 1000 MCG TABS Take 1 tablet by mouth daily.     Marland Kitchen gabapentin (NEURONTIN) 100 MG capsule Take 100 mg by mouth daily.     Marland Kitchen JARDIANCE 25 MG TABS tablet     . ketoconazole (NIZORAL) 2 % shampoo Apply 1 application topically once a week.  0  . LANTUS SOLOSTAR 100 UNIT/ML Solostar Pen 20 Units every morning.    Marland Kitchen LORazepam (ATIVAN) 2 MG tablet Take 2 mg by mouth at bedtime.    . metFORMIN (GLUCOPHAGE-XR) 500 MG 24 hr tablet     . NITROSTAT 0.4 MG SL tablet Place 0.4 mg under the tongue every 5 (five) minutes x 3 doses as needed.  0  . Omega-3 Fatty Acids (FISH OIL) 1000 MG CAPS Take 1,000 mg by mouth 2 (two) times daily.     Marland Kitchen omeprazole (PRILOSEC) 40 MG capsule Take 40 mg by mouth daily.    . pilocarpine (SALAGEN) 5 MG tablet Take by mouth 2 (two) times daily.    . temazepam (RESTORIL) 30 MG capsule Take 30 mg by mouth at bedtime as needed for sleep.    . TOPROL XL 25 MG 24 hr tablet TAKE 1 TABLET DAILY 90 tablet 4  . VITAMIN D PO Take 1,200 mg by mouth 2 (two) times daily.     No current facility-administered medications on file prior to visit.     Cardiovascular studies:   Coronary angiography 01/02/2015: LM: Normal LAD: ostial 10%, mid 10%, septal perforator 70% pstial stenoses LCx: Normal RCA: Normal. Dominant  Echo- 12/21/2014: 1. Left ventricle cavity is normal in size. Normal global wall motion. Doppler evidence of grade I (impaired) diastolic dysfunction. Calculated EF 66%. 2. Mild mitral regurgitation. 3. Trace tricuspid regurgitation  Lexiscan sestamibi stress test 12/01/2014:  1. The resting electrocardiogram demonstrated normal sinus rhythm, normal resting conduction, Poor R progression, no resting arrhythmias and nonspecific T inversionchanges in inferior and lateral leads.  Stress EKG is nondiagnostic for ischemia as it is a Pharmacologic stress test using Lexiscan infusion. Stress symptoms  included dyspnea, dizziness. 2. The T.I.D. is 1.49 and suggests transient ischemic dilatation. Perfusion imaging study demonstrates possible septal ischemia, small sized.  The sum difference on polar plot images was only 2.  The left ventricular systolic function Calculated by QGS was 59%, there was no wall motion abnormality.  Overall this represents an intermediate risk, clinical correlation is recommended.  Recent labs: 12/30/2016: Glucose 139.  BUN/creatinine 14/0.8.  EGFR 72/88.  sodium 141, potassium 4.2. H/H 15/46.  Platelets 235.  MCV normal Cholesterol 127,  triglycerides 90, HDL 36, LDL 73  Review of Systems  Constitution: Negative for decreased appetite, malaise/fatigue, weight gain and weight loss.  HENT: Negative for congestion.   Eyes: Negative for visual disturbance.  Cardiovascular: Negative for chest pain, dyspnea on exertion, leg swelling, palpitations and syncope.  Respiratory: Negative for shortness of breath.   Endocrine: Negative for cold intolerance.  Hematologic/Lymphatic: Does not bruise/bleed easily.  Skin: Negative for itching and rash.  Musculoskeletal: Negative for myalgias.  Gastrointestinal: Negative for abdominal pain, nausea and vomiting.  Genitourinary: Negative for dysuria.  Neurological: Negative for dizziness and weakness.  Psychiatric/Behavioral: The patient is not nervous/anxious.   All other systems reviewed and are negative.        Vitals:   01/05/19 1001  BP: 110/70   (Measured by the patient using a home BP monitor)   Observation/findings during video visit   Objective:   Physical Exam  Constitutional: She is oriented to person, place, and time. She appears well-developed and well-nourished. No distress.  Neck: No JVD present.  Pulmonary/Chest: Effort normal.  Musculoskeletal:        General: No edema.  Neurological: She is alert and oriented to person, place, and time.  Psychiatric: She has a normal mood and affect.  Nursing  note and vitals reviewed.         Assessment & Recommendations:   63 year old Caucasian female with mild nonobstructive coronary artery disease, type II diabetes mellitus, hyperlipidemia, strong family history of premature CAD, former smoker, lung cancer status post right lobectomy.  CAD: Nonobstructive CAD on cath in 2016 Continue aspirin, lipitor, fish oil, metoprolol  Hypertension: Well controlled.  Hyperlipidemia: Well controlled  Type 2 DM: Uncontrolled. F/u with Dr. Baldwin Crown office.   I have asked the patient to send any labwork results from Dr. Forde Dandy office  I recommended heart healthy diet that includes more of lean meat, fish, fruits, vegetables, nuts, olives (Meditarranean diet) and less of processed food, red meat, dairy, cheese, fried food, sugar, excess salt. Recommend daily exercise with at least 30 min walking or 15 min high intensity exercise most days a week.  I have discussed with patient regarding the safety during COVID Pandemic and steps and precautions to be taken including social distancing, frequent hand wash and use of detergent soap, gels with the patient. I asked the patient to avoid touching mouth, nose, eyes, ears with her hands. I encouraged heart healthy diet, regular exercise- as long as social distancing can be maintained.   Follow up in 1 year  Nigel Mormon, MD [facility] Pager: [phone] Office: [phone] If no answer Cell [phone]

## 2019-01-06 ENCOUNTER — Ambulatory Visit (INDEPENDENT_AMBULATORY_CARE_PROVIDER_SITE_OTHER): Admitting: Family Medicine

## 2019-01-06 ENCOUNTER — Ambulatory Visit (INDEPENDENT_AMBULATORY_CARE_PROVIDER_SITE_OTHER)

## 2019-01-06 ENCOUNTER — Encounter: Payer: Self-pay | Admitting: Family Medicine

## 2019-01-06 VITALS — BP 149/62 | HR 72 | Ht 63.5 in | Wt 181.0 lb

## 2019-01-06 DIAGNOSIS — M5412 Radiculopathy, cervical region: Secondary | ICD-10-CM

## 2019-01-06 DIAGNOSIS — M50122 Cervical disc disorder at C5-C6 level with radiculopathy: Secondary | ICD-10-CM | POA: Diagnosis not present

## 2019-01-06 MED ORDER — GABAPENTIN 300 MG PO CAPS: ORAL_CAPSULE | ORAL | 3 refills | Status: DC

## 2019-01-06 NOTE — Progress Notes (Signed)
Kelli Rojas is a 63 y.o. female who presents to [facility] today for right arm pain.  Kenneth notes a 2-week history of pain radiating down her right arm to the right hand.  Pain will sometimes occur at rest and sometimes be worse with activity.  She notes that specifically neck motion tends to reproduce her pain.  She denies any injury history.  She notes that she has been a little more active recently doing home projects cleaning and gardening but does not think of anything outstanding this happened.  She has had not had a trial of treatment yet.  She does have a pertinent past surgical history for right rotator cuff surgery about 5 years ago.  She denies any weakness or numbness fevers or chills nausea vomiting or diarrhea.    ROS:  As above  Exam:  BP (!) 149/62   Pulse 72   Ht 5' 3.5" (1.613 m)   Wt 181 lb (82.1 kg)   BMI 31.56 kg/m  Wt Readings from Last 5 Encounters:  01/06/19 181 lb (82.1 kg)  01/05/19 179 lb (81.2 kg)  08/13/18 180 lb (81.6 kg)  08/04/18 182 lb (82.6 kg)  07/30/18 182 lb (82.6 kg)   General: Well Developed, well nourished, and in no acute distress.  Neuro/Psych: Alert and oriented x3, extra-ocular muscles intact, able to move all 4 extremities, sensation grossly intact. Skin: Warm and dry, no rashes noted.  Respiratory: Not using accessory muscles, speaking in full sentences, trachea midline.  Cardiovascular: Pulses palpable, no extremity edema. Abdomen: Does not appear distended. MSK:  C-spine: Nontender to spinal midline.  Not particularly tender along paraspinal musculature. Cervical motion intact however pain is reproduced down her right arm with neck flexion and right lateral flexion.  Otherwise range of motion normal. Positive right-sided Spurling's test. Upper extremity strength reflexes and sensation are equal and intact bilateral upper extremities.  Right shoulder normal-appearing nontender normal  motion normal strength.  Right elbow normal-appearing nontender normal motion normal strength.  Right wrist and hand normal-appearing no tenderness.  No elbow pain with resisted wrist extension. Strength is intact.  Pulses cap refill and sensation are intact.  Contralateral left arm normal shoulder elbow wrist and hand motion and strength.  Pulses intact bilateral upper extremities.    Lab and Radiology Results  X-ray images C-spine personally independently reviewed Patient does have degenerative changes present at C5-6 C7-T1.  No acute fractures or severe degenerative changes. Await formal radiology review   Assessment and Plan: 63 y.o. female with right arm pain very likely C8 cervical radiculopathy.  She does have some degenerative changes in the area of her cervical spine that would make sense for this dermatome.  Discussed options.  Plan to adjust gabapentin.  She takes gabapentin for peripheral neuropathy but only is taking 200 mg at a time.  Increase the dose to 300 mg at bedtime with an option to increase it to 3 times daily.  We discussed precautions and will recheck as needed.  Would like to avoid steroids due to her existing diagnosis of diabetes as well as the current COVID-19 pandemic.  Patient will sign up for my chart and keep in touch.   PDMP not reviewed this encounter. Orders Placed This Encounter  Procedures  . DG Cervical Spine Complete    Standing Status:   Future    Number of Occurrences:   1    Standing Expiration Date:   03/07/2020  Order Specific Question:   Reason for Exam (SYMPTOM  OR DIAGNOSIS REQUIRED)    Answer:   eval likely rt c8 radicuopathy    Order Specific Question:   Preferred imaging location?    Answer:   [facility]    Order Specific Question:   Radiology Contrast Protocol - do NOT remove file path    Answer:   [email or web address]   Meds ordered this encounter  Medications  . gabapentin  (NEURONTIN) 300 MG capsule    Sig: One tab PO qHS for a week, then BID for a week, then TID. May double weekly to a max of 3,600mg /day    Dispense:  180 capsule    Refill:  3    Historical information moved to improve visibility of documentation.  Past Medical History:  Diagnosis Date  . Anxiety   . Cancer ([place])    lung  . Depression   . Diabetes mellitus without complication ([place])   . Diabetic peripheral neuropathy ([place])   . High cholesterol   . Parkinson's disease ([place])   . Thyroid disease    Past Surgical History:  Procedure Laterality Date  . LEFT HEART CATHETERIZATION WITH CORONARY ANGIOGRAM N/A 01/02/2015   Procedure: LEFT HEART CATHETERIZATION WITH CORONARY ANGIOGRAM;  Surgeon: Adrian Prows, MD;  Location: [facility] CATH LAB;  Service: Cardiovascular;  Laterality: N/A;  . LUNG CANCER SURGERY    . MOUTH SURGERY  03/22/2018  . ROTATOR CUFF REPAIR Right Around 2015   Social History   Tobacco Use  . Smoking status: Former Smoker    Packs/day: 1.00    Years: 20.00    Pack years: 20.00    Types: Cigarettes    Last attempt to quit: 06/26/2010    Years since quitting: 8.5  . Smokeless tobacco: Never Used  Substance Use Topics  . Alcohol use: No    Alcohol/week: 0.0 standard drinks   family history includes Diabetes in her sister; Heart disease in her sister; Heart failure in her mother; Non-Hodgkin's lymphoma in her father.  Medications: Current Outpatient Medications  Medication Sig Dispense Refill  . aspirin 81 MG tablet Take 81 mg by mouth daily.    Marland Kitchen atorvastatin (LIPITOR) 40 MG tablet TAKE 1 TABLET DAILY 90 tablet 4  . Calcium Carbonate-Vit D-Min (CALCIUM 1200 PO) Take 1 tablet by mouth daily.    . chlorhexidine (PERIDEX) 0.12 % solution RINSE WITH 15 ML BID FOR 2 WEEKS    . Cyanocobalamin (B-12) 1000 MCG TABS Take 1 tablet by mouth daily.     . DULoxetine (CYMBALTA) 30 MG capsule daily.    Marland Kitchen JARDIANCE 25 MG TABS tablet     . ketoconazole (NIZORAL) 2 % shampoo Apply 1  application topically once a week.  0  . LANTUS SOLOSTAR 100 UNIT/ML Solostar Pen 22 Units every morning.     Marland Kitchen LORazepam (ATIVAN) 2 MG tablet Take 2 mg by mouth at bedtime.    Marland Kitchen NITROSTAT 0.4 MG SL tablet Place 0.4 mg under the tongue every 5 (five) minutes x 3 doses as needed.  0  . Omega-3 Fatty Acids (FISH OIL) 1000 MG CAPS Take 1,000 mg by mouth 2 (two) times daily.     Marland Kitchen omeprazole (PRILOSEC) 40 MG capsule Take 40 mg by mouth daily.    . pilocarpine (SALAGEN) 5 MG tablet Take by mouth. 7.5mg  tid    . RESTASIS 0.05 % ophthalmic emulsion daily.    . temazepam (RESTORIL) 30 MG capsule Take 30 mg by mouth  at bedtime as needed for sleep.    . TOPROL XL 25 MG 24 hr tablet TAKE 1 TABLET DAILY 90 tablet 4  . VITAMIN D PO Take 50,000 mg by mouth once a week.     . gabapentin (NEURONTIN) 300 MG capsule One tab PO qHS for a week, then BID for a week, then TID. May double weekly to a max of 3,600mg /day 180 capsule 3   No current facility-administered medications for this visit.    Allergies  Allergen Reactions  . Codeine   . Crestor  [Rosuvastatin]     myalgia  . Hydromorphone Hcl   . Trazodone And Nefazodone     HA, sleepy hangover      Discussed warning signs or symptoms. Please see discharge instructions. Patient expresses understanding.

## 2019-01-06 NOTE — Patient Instructions (Signed)
Thank you for coming in today.  I think your arm pain is due to a pinched nerve in your neck.   We will use gabapentin to treat it.   STOP the 100mg  gabapentin pills.  START the 300mg  gabapentin pills.  Start 300mg  at night and increase to three times daily as needed.   Let me know if your symptoms worsen or do not improve.   Come back or go to the emergency room if you notice new weakness new numbness problems walking or bowel or bladder problems.   Cervical Radiculopathy  Cervical radiculopathy happens when a nerve in the neck (cervical nerve) is pinched or bruised. This condition can develop because of an injury or as part of the normal aging process. Pressure on the cervical nerves can cause pain or numbness that runs from the neck all the way down into the arm and fingers. Usually, this condition gets better with rest. Treatment may be needed if the condition does not improve. What are the causes? This condition may be caused by:  Injury.  Slipped (herniated) disk.  Muscle tightness in the neck because of overuse.  Arthritis.  Breakdown or degeneration in the bones and joints of the spine (spondylosis) due to aging.  Bone spurs that may develop near the cervical nerves. What are the signs or symptoms? Symptoms of this condition include:  Pain that runs from the neck to the arm and hand. The pain can be severe or irritating. It may be worse when the neck is moved.  Numbness or weakness in the affected arm and hand. How is this diagnosed? This condition may be diagnosed based on symptoms, medical history, and a physical exam. You may also have tests, including:  X-rays.  CT scan.  MRI.  Electromyogram (EMG).  Nerve conduction tests. How is this treated? In many cases, treatment is not needed for this condition. With rest, the condition usually gets better over time. If treatment is needed, options may include:  Wearing a soft neck collar for short periods of  time.  Physical therapy to strengthen your neck muscles.  Medicines, such as NSAIDs, oral corticosteroids, or spinal injections.  Surgery. This may be needed if other treatments do not help. Various types of surgery may be done depending on the cause of your problems. Follow these instructions at home: Managing pain  Take over-the-counter and prescription medicines only as told by your health care provider.  If directed, apply ice to the affected area. ? Put ice in a plastic bag. ? Place a towel between your skin and the bag. ? Leave the ice on for 20 minutes, 2-3 times per day.  If ice does not help, you can try using heat. Take a warm shower or warm bath, or use a heat pack as told by your health care provider.  Try a gentle neck and shoulder massage to help relieve symptoms. Activity  Rest as needed. Follow instructions from your health care provider about any restrictions on activities.  Do stretching and strengthening exercises as told by your health care provider or physical therapist. General instructions  If you were given a soft collar, wear it as told by your health care provider.  Use a flat pillow when you sleep.  Keep all follow-up visits as told by your health care provider. This is important. Contact a health care provider if:  Your condition does not improve with treatment. Get help right away if:  Your pain gets much worse and cannot be  controlled with medicines.  You have weakness or numbness in your hand, arm, face, or leg.  You have a high fever.  You have a stiff, rigid neck.  You lose control of your bowels or your bladder (have incontinence).  You have trouble with walking, balance, or speaking. This information is not intended to replace advice given to you by your health care provider. Make sure you discuss any questions you have with your health care provider. Document Released: 06/10/2001 Document Revised: 02/21/2016 Document Reviewed:  11/09/2014 Elsevier Interactive Patient Education  [employer].

## 2019-09-06 DIAGNOSIS — H8110 Benign paroxysmal vertigo, unspecified ear: Secondary | ICD-10-CM | POA: Insufficient documentation

## 2019-09-06 DIAGNOSIS — K1379 Other lesions of oral mucosa: Secondary | ICD-10-CM | POA: Insufficient documentation

## 2020-01-06 ENCOUNTER — Ambulatory Visit: Admitting: Cardiology

## 2020-01-06 ENCOUNTER — Encounter: Payer: Self-pay | Admitting: Cardiology

## 2020-01-06 ENCOUNTER — Other Ambulatory Visit: Payer: Self-pay

## 2020-01-06 VITALS — BP 115/57 | HR 79 | Ht 60.0 in | Wt 173.1 lb

## 2020-01-06 DIAGNOSIS — E782 Mixed hyperlipidemia: Secondary | ICD-10-CM

## 2020-01-06 DIAGNOSIS — Z794 Long term (current) use of insulin: Secondary | ICD-10-CM

## 2020-01-06 DIAGNOSIS — I2510 Atherosclerotic heart disease of native coronary artery without angina pectoris: Secondary | ICD-10-CM

## 2020-01-06 DIAGNOSIS — I10 Essential (primary) hypertension: Secondary | ICD-10-CM | POA: Insufficient documentation

## 2020-01-06 DIAGNOSIS — E119 Type 2 diabetes mellitus without complications: Secondary | ICD-10-CM

## 2020-01-06 NOTE — Progress Notes (Signed)
Subjective:   Kelli Rojas, female    DOB: 1956-05-21, 64 y.o.   MRN: [id number]    Chief complaint:  Coronary artery disease  HPI  64 year old Caucasian female with mild nonobstructive coronary artery disease, type II diabetes mellitus, hyperlipidemia, strong family history of premature CAD, former smoker, lung cancer status post right lobectomy.  Patient walks 10,000 steps 6 days a week, without any symptoms of chest pain, shortness of breath.  She has an episode of left-sided chest pain bilateral, lasting for 30 minutes, unrelated to exertion.  On a separate note, she has complains of throbbing pain in her right ring finger, unrelated to exposure to cold.  TSH was low at 0.08? In 03/2019  Current Outpatient Medications on File Prior to Visit  Medication Sig Dispense Refill  . cevimeline (EVOXAC) 30 MG capsule Take 30 mg by mouth 3 (three) times daily.    . Chromium 1000 MCG TABS Take 1 tablet by mouth daily.    Marland Kitchen levothyroxine (SYNTHROID) 137 MCG tablet Take 137 mcg by mouth daily before breakfast.    . Probiotic Product (PROBIOTIC DAILY PO) Take 1 capsule by mouth daily.    Marland Kitchen aspirin 81 MG tablet Take 81 mg by mouth daily.    Marland Kitchen atorvastatin (LIPITOR) 40 MG tablet TAKE 1 TABLET DAILY 90 tablet 4  . benzonatate (TESSALON) 100 MG capsule Take by mouth.    . Calcium Carbonate-Vit D-Min (CALCIUM 1200 PO) Take 1 tablet by mouth daily.    . chlorhexidine (PERIDEX) 0.12 % solution RINSE WITH 15 ML BID FOR 2 WEEKS    . Cyanocobalamin (B-12) 1000 MCG TABS Take 1 tablet by mouth daily.     . DULoxetine (CYMBALTA) 30 MG capsule daily.    Marland Kitchen gabapentin (NEURONTIN) 300 MG capsule One tab PO qHS for a week, then BID for a week, then TID. May double weekly to a max of 3,651m/day 180 capsule 3  . JARDIANCE 25 MG TABS tablet     . ketoconazole (NIZORAL) 2 % shampoo Apply 1 application topically once a week.  0  . LANTUS SOLOSTAR 100 UNIT/ML Solostar Pen 30 Units every morning.    .Marland KitchenLORazepam  (ATIVAN) 2 MG tablet Take 2 mg by mouth at bedtime.    .Marland KitchenNITROSTAT 0.4 MG SL tablet Place 0.4 mg under the tongue every 5 (five) minutes x 3 doses as needed.  0  . Omega-3 Fatty Acids (FISH OIL) 1000 MG CAPS Take 1,000 mg by mouth 2 (two) times daily.     .Marland Kitchenomeprazole (PRILOSEC) 40 MG capsule Take 40 mg by mouth daily.    . pilocarpine (SALAGEN) 5 MG tablet Take by mouth. 7.574mtid    . RESTASIS 0.05 % ophthalmic emulsion daily.    . temazepam (RESTORIL) 30 MG capsule Take 30 mg by mouth at bedtime as needed for sleep.    . TOPROL XL 25 MG 24 hr tablet TAKE 1 TABLET DAILY 90 tablet 4  . VITAMIN D PO Take 50,000 mg by mouth once a week.      No current facility-administered medications on file prior to visit.    Cardiovascular studies:  EKG 01/06/2020: Sinus rhythm 73 bpm. Nonspecific T-abnormality.   Coronary angiography 01/02/2015: LM: Normal LAD: ostial 10%, mid 10%, septal perforator 70% ostial stenoses LCx: Normal RCA: Normal. Dominant  Echocardiogram 12/21/2014: 1. Left ventricle cavity is normal in size. Normal global wall motion. Doppler evidence of grade I (impaired) diastolic dysfunction. Calculated EF 66%.  2. Mild mitral regurgitation. 3. Trace tricuspid regurgitation  Lexiscan sestamibi stress test 12/01/2014:  1. The resting electrocardiogram demonstrated normal sinus rhythm, normal resting conduction, Poor R progression, no resting arrhythmias and nonspecific T inversionchanges in inferior and lateral leads.  Stress EKG is nondiagnostic for ischemia as it is a Pharmacologic stress test using Lexiscan infusion. Stress symptoms included dyspnea, dizziness. 2. The T.I.D. is 1.49 and suggests transient ischemic dilatation. Perfusion imaging study demonstrates possible septal ischemia, small sized.  The sum difference on polar plot images was only 2.  The left ventricular systolic function Calculated by QGS was 59%, there was no wall motion abnormality.  Overall this represents  an intermediate risk, clinical correlation is recommended.  Recent labs: 04/08/2019: Glucose 123, BUN/Cr 13/0.8. EGFR 72 HbA1C 7.0% Chol 130, TG 86, HDL 37, LDL 76 TSH 0.08 ?low   Review of Systems  Cardiovascular: Negative for chest pain, dyspnea on exertion, leg swelling, palpitations and syncope.         Vitals:   01/06/20 0937  BP: (!) 115/57  Pulse: 79  SpO2: 94%    Objective:   Physical Exam  Constitutional: She appears well-developed and well-nourished.  Neck: No JVD present.  Cardiovascular: Normal rate, regular rhythm, normal heart sounds and intact distal pulses.  No murmur heard. Pulmonary/Chest: Effort normal and breath sounds normal. She has no wheezes. She has no rales.  Musculoskeletal:        General: No edema.  Nursing note and vitals reviewed.         Assessment & Recommendations:   64 year old Caucasian female with mild nonobstructive coronary artery disease, type II diabetes mellitus, hyperlipidemia, strong family history of premature CAD, former smoker, lung cancer status post right lobectomy.  CAD: Nonobstructive CAD on cath in 2016 Continue aspirin, lipitor, fish oil, metoprolol I do think her resting chest pain is angina.  Hypertension: Well controlled.  Hyperlipidemia: Controlled  Type 2 DM: Controlled  I have asked her to follow-up with her PCP regarding 2 issues.  1, her TSH was reported at 0.08 in July 2020.  She does not have any signs/symptoms of hypothyroidism, but this may need follow-up.  Also, I do not think her right finger pain is related to Raynaud's per minute.  Suspect musculoskeletal cause.  Follow-up in 1 year.  Nigel Mormon, MD [facility] Pager: [phone] Office: [phone] If no answer Cell [phone]

## 2020-02-12 ENCOUNTER — Other Ambulatory Visit: Payer: Self-pay | Admitting: Cardiology

## 2020-02-20 ENCOUNTER — Other Ambulatory Visit: Payer: Self-pay | Admitting: Cardiology

## 2020-05-17 IMAGING — DX DG FOOT COMPLETE 3+V*L*
3 series · 3 of 3 positions shown · non-contrast
Comparison: 07/02/2018, 06/18/2018

CLINICAL DATA: 61-year-old female with a history of left third toe
injury

EXAM:
LEFT FOOT - COMPLETE 3+ VIEW

[foot ap]
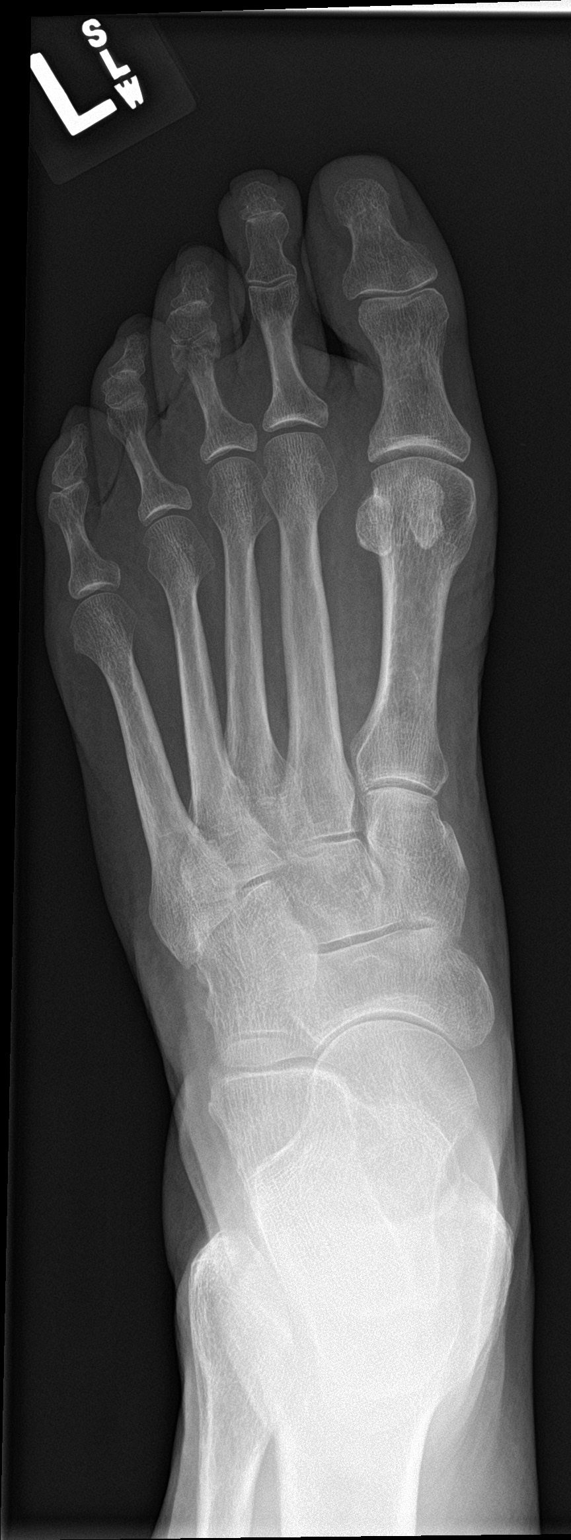

[foot obl]
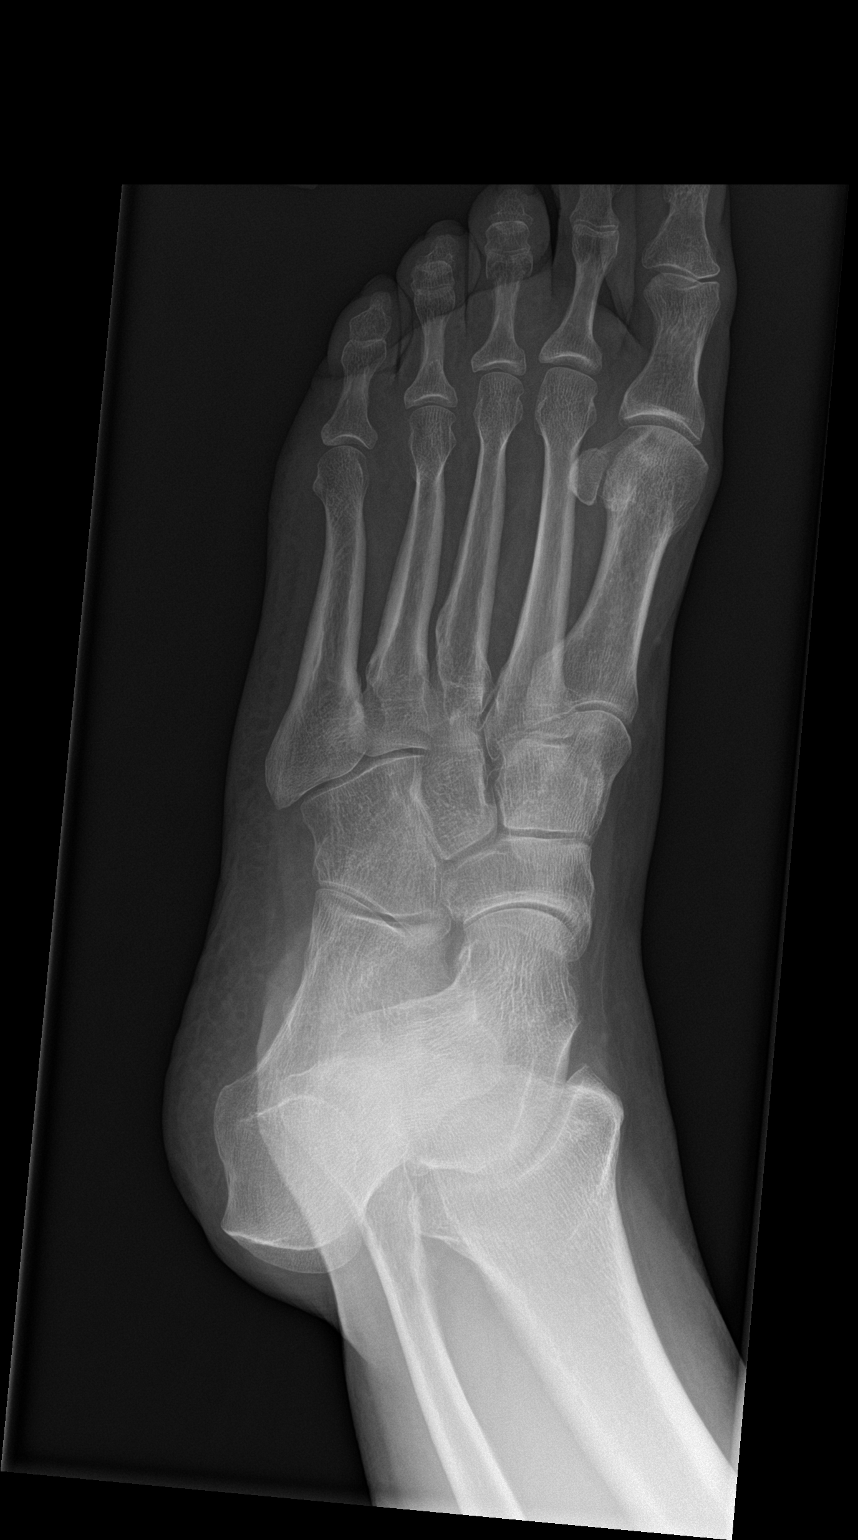

[foot lat]
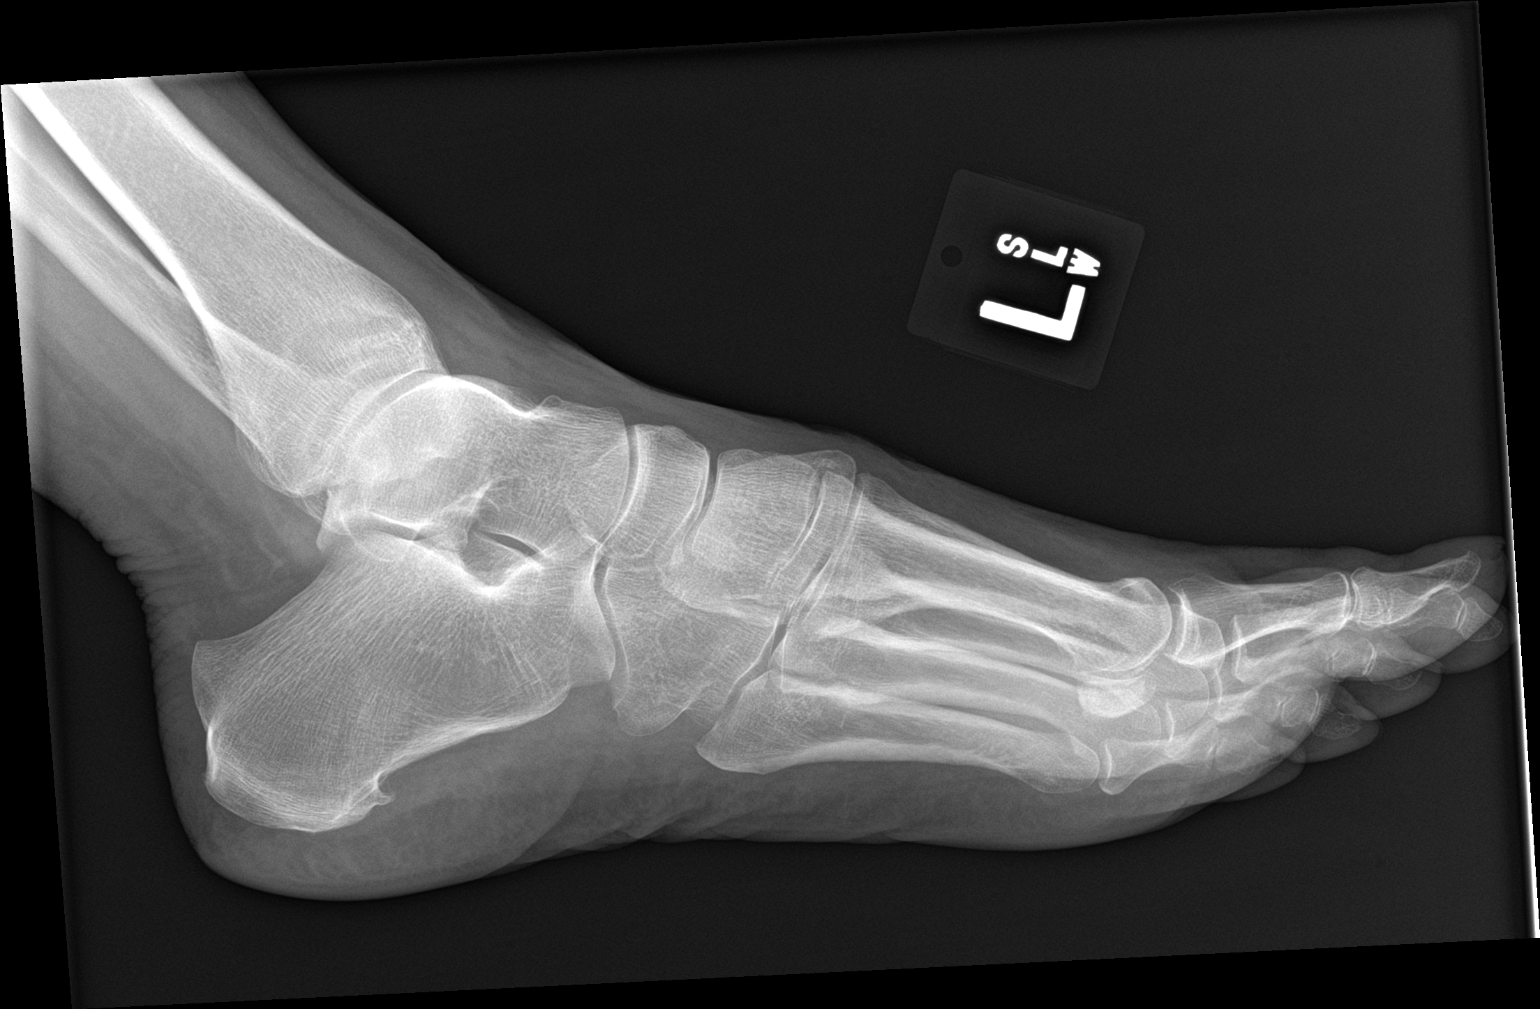

[3 of 3 positions shown; findings below may reference images not displayed]

FINDINGS: Partially healed fracture at the head of the proximal phalanx of the
third digit left foot with unchanged configuration.

No new fracture identified.

No focal soft tissue swelling. No radiopaque foreign body.
Degenerative changes of the midfoot.
IMPRESSION: Incompletely healed/remodeled fracture at the head of the proximal
phalanx, third digit, left foot

## 2020-05-29 ENCOUNTER — Encounter: Payer: Self-pay | Admitting: Sports Medicine

## 2020-05-29 ENCOUNTER — Other Ambulatory Visit: Payer: Self-pay

## 2020-05-29 ENCOUNTER — Ambulatory Visit (INDEPENDENT_AMBULATORY_CARE_PROVIDER_SITE_OTHER): Admitting: Sports Medicine

## 2020-05-29 ENCOUNTER — Ambulatory Visit (INDEPENDENT_AMBULATORY_CARE_PROVIDER_SITE_OTHER)

## 2020-05-29 DIAGNOSIS — M79644 Pain in right finger(s): Secondary | ICD-10-CM

## 2020-05-29 DIAGNOSIS — M7742 Metatarsalgia, left foot: Secondary | ICD-10-CM | POA: Diagnosis not present

## 2020-05-29 MED ORDER — MELOXICAM 15 MG PO TABS: ORAL_TABLET | ORAL | 3 refills | Status: DC

## 2020-05-29 NOTE — Progress Notes (Signed)
    Procedures performed today:    None.  Independent interpretation of notes and tests performed by another provider:   None.  Brief History, Exam, Impression, and Recommendations:    Kelli Rojas is a pleasant 64 yo female who presents today with pain on the ulnar aspect of her ring finger adjacent to the nailbed. This has been ongoing for 6 months. Her exam was unremarkable except for tenderness to palpation in that area. I suspect that she may have a paronychia. We are going to get Xrays of the finger today and give her some meloxicam.  She has also had pain at the MTPs of her right foot. This is most likely metatarsalgia as the short arch of her foot does show some drop causing her to bear weight on the middle toes. We are going to set her up for custom orthotics and have her follow up in 4-6 weeks for reevaluation.   Marcelino Duster, MS3   ___________________________________________ Gwen Her. Dianah Field, M.D., ABFM., CAQSM. Primary Care and [place] [employer] of [employer]

## 2020-05-29 NOTE — Assessment & Plan Note (Signed)
Kelli Rojas has also had some pain on the plantar aspect of her MTPs, she certainly has metatarsalgia with drop of the transverse arch and only minimal abnormal callus. Referral to Dr. Raeford Razor for custom orthotics with metatarsal pads.

## 2020-05-29 NOTE — Assessment & Plan Note (Signed)
This is a pleasant 64 year old female, for a long time she said pain that she localizes on the ulnar aspect of her right ring finger, she does get gel/acrylic nails frequently, I do suspect that there is a mild underlying paronychia though her exam is completely unremarkable with the exception of tenderness to palpation. Adding meloxicam, she will avoid getting her nails done for a couple of months. Getting some x-rays of her ring finger. Return to see me in a month.

## 2020-05-30 ENCOUNTER — Telehealth: Payer: Self-pay | Admitting: Family Medicine

## 2020-05-30 NOTE — Telephone Encounter (Signed)
Called pt to schedule appt for Orthotics as referred by Dr.Thekkekandam --per states will chk w/ [employer] to see if covered 1st,then call us back if she wishes to proceed after advising her of cost of inserts   --glh

## 2020-06-19 ENCOUNTER — Other Ambulatory Visit: Payer: Self-pay

## 2020-06-19 ENCOUNTER — Encounter: Payer: Self-pay | Admitting: Family Medicine

## 2020-06-19 ENCOUNTER — Ambulatory Visit (INDEPENDENT_AMBULATORY_CARE_PROVIDER_SITE_OTHER): Admitting: Family Medicine

## 2020-06-19 DIAGNOSIS — M7742 Metatarsalgia, left foot: Secondary | ICD-10-CM | POA: Diagnosis not present

## 2020-06-19 NOTE — Assessment & Plan Note (Signed)
Has also transverse arch and tenderness on the plantar aspect of metatarsal joints. -Counseled on home exercise therapy and supportive care. -Orthotics with metatarsal pads.

## 2020-06-19 NOTE — Progress Notes (Signed)
Kelli Rojas - 64 y.o. female MRN [id number]  Date of birth: 15-Jan-1956  SUBJECTIVE:  Including CC & ROS.  Chief Complaint  Patient presents with  . Foot Orthotics    Kelli Rojas is a 64 y.o. female that is presenting with bilateral foot pain.  She has pain on the plantar aspect of the each foot.  She does walk for exercise.  Pain is intermittent in nature.   Review of Systems See HPI   HISTORY: Past Medical, Surgical, Social, and Family History Reviewed & Updated per EMR.   Pertinent Historical Findings include:  Past Medical History:  Diagnosis Date  . Anxiety   . Cancer ([employer])    lung  . Depression   . Diabetes mellitus without complication ([place])   . Diabetic peripheral neuropathy ([place])   . High cholesterol   . Parkinson's disease ([place])   . Thyroid disease     Past Surgical History:  Procedure Laterality Date  . LEFT HEART CATHETERIZATION WITH CORONARY ANGIOGRAM N/A 01/02/2015   Procedure: LEFT HEART CATHETERIZATION WITH CORONARY ANGIOGRAM;  Surgeon: Adrian Prows, MD;  Location: [facility] CATH LAB;  Service: Cardiovascular;  Laterality: N/A;  . LUNG CANCER SURGERY    . MOUTH SURGERY  03/22/2018  . ROTATOR CUFF REPAIR Right Around 2015    Family History  Problem Relation Age of Onset  . Heart failure Mother   . Non-Hodgkin's lymphoma Father   . Diabetes Sister   . Heart disease Sister   . Heart disease Sister   . Diabetes Sister   . Heart disease Sister   . Diabetes Sister     Social History   Socioeconomic History  . Marital status: Married    Spouse name: Not on file  . Number of children: 0  . Years of education: Not on file  . Highest education level: Not on file  Occupational History  . Not on file  Tobacco Use  . Smoking status: Former Smoker    Packs/day: 1.00    Years: 20.00    Pack years: 20.00    Types: Cigarettes    Quit date: 06/26/2010    Years since quitting: 9.9  . Smokeless tobacco: Never Used  Vaping Use  . Vaping Use: Never used    Substance and Sexual Activity  . Alcohol use: No    Alcohol/week: 0.0 standard drinks  . Drug use: No  . Sexual activity: Not on file  Other Topics Concern  . Not on file  Social History Narrative  . Not on file   Social Determinants of Health   Financial Resource Strain:   . Difficulty of Paying Living Expenses: Not on file  Food Insecurity:   . Worried About Charity fundraiser in the Last Year: Not on file  . Ran Out of Food in the Last Year: Not on file  Transportation Needs:   . Lack of Transportation (Medical): Not on file  . Lack of Transportation (Non-Medical): Not on file  Physical Activity:   . Days of Exercise per Week: Not on file  . Minutes of Exercise per Session: Not on file  Stress:   . Feeling of Stress : Not on file  Social Connections:   . Frequency of Communication with Friends and Family: Not on file  . Frequency of Social Gatherings with Friends and Family: Not on file  . Attends Religious Services: Not on file  . Active Member of Clubs or Organizations: Not on file  . Attends  Club or Organization Meetings: Not on file  . Marital Status: Not on file  Intimate Partner Violence:   . Fear of Current or Ex-Partner: Not on file  . Emotionally Abused: Not on file  . Physically Abused: Not on file  . Sexually Abused: Not on file     PHYSICAL EXAM:  VS: BP 140/77   Pulse 80   Ht 5\' 4"  (1.626 m)   Wt 175 lb (79.4 kg)   BMI 30.04 kg/m  Physical Exam Gen: NAD, alert, cooperative with exam, well-appearing MSK:  Right and left foot: Loss of transverse arch. Tenderness to palpation of the metatarsal joints. No swelling or ecchymosis. Neurovascularly intact  Patient was fitted for a standard, cushioned, semi-rigid orthotic. The orthotic was heated and afterward the patient stood on the orthotic blank positioned on the orthotic stand. The patient was positioned in subtalar neutral position and 10 degrees of ankle dorsiflexion in a weight bearing  stance. After completion of molding, a stable base was applied to the orthotic blank. The blank was ground to a stable position for weight bearing. Size: 9W Pairs: 2 Base: Blue EVA Additional Posting and Padding: MT pads The patient ambulated these, and they were very comfortable.   ASSESSMENT & PLAN:   Metatarsalgia, left foot Has also transverse arch and tenderness on the plantar aspect of metatarsal joints. -Counseled on home exercise therapy and supportive care. -Orthotics with metatarsal pads.

## 2020-06-26 ENCOUNTER — Ambulatory Visit: Admitting: Sports Medicine

## 2021-01-07 ENCOUNTER — Ambulatory Visit: Admitting: Cardiology

## 2021-02-06 ENCOUNTER — Ambulatory Visit: Payer: Medicare Other | Admitting: Cardiology

## 2021-02-06 ENCOUNTER — Encounter: Payer: Self-pay | Admitting: Cardiology

## 2021-02-06 ENCOUNTER — Other Ambulatory Visit: Payer: Self-pay

## 2021-02-06 VITALS — BP 119/67 | HR 74 | Temp 98.0°F | Resp 17 | Ht 64.0 in | Wt 189.0 lb

## 2021-02-06 DIAGNOSIS — I2510 Atherosclerotic heart disease of native coronary artery without angina pectoris: Secondary | ICD-10-CM

## 2021-02-06 NOTE — Progress Notes (Signed)
Subjective:   SHEALA DOSH, female    DOB: 1956/09/20, 65 y.o.   MRN: [id number]    Chief complaint:  Coronary artery disease  HPI  65 year old Caucasian female with mild nonobstructive coronary artery disease, type II diabetes mellitus, hyperlipidemia, strong family history of premature CAD, former smoker, lung cancer status post right lobectomy.  At her last visit in 2021, she was walking 10,000 steps a day.  However, since then, her "walking buddy" has moved.  Patient is now not walking on a regular basis at all.  With her baseline sedentary lifestyle, she denies any chest pain or shortness of breath symptoms.  Reviewed recent labs with the patient, details below.  Lipids well controlled.  A1c remains elevated, follows up with Dr. Duard Brady.  Current Outpatient Medications on File Prior to Visit  Medication Sig Dispense Refill  . aspirin 81 MG tablet Take 81 mg by mouth daily.    Marland Kitchen atorvastatin (LIPITOR) 40 MG tablet TAKE 1 TABLET DAILY 90 tablet 3  . benzonatate (TESSALON) 100 MG capsule Take by mouth.    . Calcium Carbonate-Vit D-Min (CALCIUM 1200 PO) Take 1 tablet by mouth daily.    . chlorhexidine (PERIDEX) 0.12 % solution RINSE WITH 15 ML BID FOR 2 WEEKS    . Cyanocobalamin (B-12) 1000 MCG TABS Take 1 tablet by mouth daily.     . DULoxetine (CYMBALTA) 30 MG capsule daily.    Marland Kitchen gabapentin (NEURONTIN) 300 MG capsule One tab PO qHS for a week, then BID for a week, then TID. May double weekly to a max of 3,651m/day 180 capsule 3  . HUMALOG KWIKPEN 100 UNIT/ML KwikPen Inject into the skin.    .Marland Kitcheniron polysaccharides (NIFEREX) 150 MG capsule Take 150 mg by mouth daily.    .Marland KitchenJARDIANCE 25 MG TABS tablet     . ketoconazole (NIZORAL) 2 % shampoo Apply 1 application topically once a week.  0  . LANTUS SOLOSTAR 100 UNIT/ML Solostar Pen 30 Units every morning.    .Marland Kitchenlevothyroxine (SYNTHROID) 137 MCG tablet Take 137 mcg by mouth daily before breakfast.    . metoprolol succinate (TOPROL-XL)  25 MG 24 hr tablet TAKE 1 TABLET DAILY 90 tablet 3  . NITROSTAT 0.4 MG SL tablet Place 0.4 mg under the tongue every 5 (five) minutes x 3 doses as needed.  0  . Omega-3 Fatty Acids (FISH OIL) 1000 MG CAPS Take 1,000 mg by mouth 2 (two) times daily.     .Marland Kitchenomeprazole (PRILOSEC) 40 MG capsule Take 40 mg by mouth daily.    . Probiotic Product (PROBIOTIC DAILY PO) Take 1 capsule by mouth daily.    . RESTASIS 0.05 % ophthalmic emulsion daily.    . temazepam (RESTORIL) 30 MG capsule Take 30 mg by mouth at bedtime as needed for sleep.    .Marland KitchenVITAMIN D PO Take 50,000 mg by mouth once a week.      No current facility-administered medications on file prior to visit.    Cardiovascular studies:  EKG 02/06/2021: Sinus rhythm 71 bpm Incomplete RBBB Low voltage in precordial leads  Coronary angiography 01/02/2015: LM: Normal LAD: ostial 10%, mid 10%, septal perforator 70% ostial stenoses LCx: Normal RCA: Normal. Dominant  Echocardiogram 12/21/2014: 1. Left ventricle cavity is normal in size. Normal global wall motion. Doppler evidence of grade I (impaired) diastolic dysfunction. Calculated EF 66%. 2. Mild mitral regurgitation. 3. Trace tricuspid regurgitation  Lexiscan sestamibi stress test 12/01/2014:  1. The resting electrocardiogram  demonstrated normal sinus rhythm, normal resting conduction, Poor R progression, no resting arrhythmias and nonspecific T inversionchanges in inferior and lateral leads.  Stress EKG is nondiagnostic for ischemia as it is a Pharmacologic stress test using Lexiscan infusion. Stress symptoms included dyspnea, dizziness. 2. The T.I.D. is 1.49 and suggests transient ischemic dilatation. Perfusion imaging study demonstrates possible septal ischemia, small sized.  The sum difference on polar plot images was only 2.  The left ventricular systolic function Calculated by QGS was 59%, there was no wall motion abnormality.  Overall this represents an intermediate risk, clinical  correlation is recommended.  Recent labs: 12/10/2020: Glucose N/A, BUN/Cr 12/0.7. EGFR 62.  HbA1C 8.3% Chol 133, TG 77, HDL 48, LDL 70 TSH 1.4 normal  04/08/2019: Glucose 123, BUN/Cr 13/0.8. EGFR 72 HbA1C 7.0% Chol 130, TG 86, HDL 37, LDL 76 TSH 0.08 ?low    Review of Systems  Cardiovascular: Negative for chest pain, dyspnea on exertion, leg swelling, palpitations and syncope.         Vitals:   02/06/21 1101  BP: 119/67  Pulse: 74  Resp: 17  Temp: 98 F (36.7 C)  SpO2: 98%    Objective:   Physical Exam Vitals and nursing note reviewed.  Constitutional:      Appearance: She is well-developed.  Neck:     Vascular: No JVD.  Cardiovascular:     Rate and Rhythm: Normal rate and regular rhythm.     Pulses: Intact distal pulses.     Heart sounds: Normal heart sounds. No murmur heard.   Pulmonary:     Effort: Pulmonary effort is normal.     Breath sounds: Normal breath sounds. No wheezing or rales.           Assessment & Recommendations:   65 year old Caucasian female with mild nonobstructive coronary artery disease, type II diabetes mellitus, hyperlipidemia, strong family history of premature CAD, former smoker, lung cancer status post right lobectomy.  CAD: Nonobstructive CAD on cath in 2016 Continue aspirin, lipitor, fish oil, metoprolol In absence of bleeding, okay to continue aspirin.  Lipids well controlled. Encourage walking 10,000 steps a day.  I will see her again in 6 months to reevaluate her symptoms on good baseline functional capacity.  Hypertension: Well controlled.  Hyperlipidemia: Controlled  Type 2 DM: Continue follow-up with Dr. Forde Dandy.  F/u in 6 months  Prospero Mahnke Esther Hardy, MD [facility]. [place] Pager: [phone] Office: [phone] If no answer Cell [phone]

## 2021-02-07 ENCOUNTER — Other Ambulatory Visit: Payer: Self-pay | Admitting: Cardiology

## 2021-02-14 ENCOUNTER — Other Ambulatory Visit: Payer: Self-pay | Admitting: Cardiology

## 2021-07-12 ENCOUNTER — Other Ambulatory Visit: Payer: Self-pay

## 2021-07-12 ENCOUNTER — Ambulatory Visit (INDEPENDENT_AMBULATORY_CARE_PROVIDER_SITE_OTHER): Payer: Medicare Other | Admitting: Podiatry

## 2021-07-12 ENCOUNTER — Encounter: Payer: Self-pay | Admitting: Podiatry

## 2021-07-12 DIAGNOSIS — F319 Bipolar disorder, unspecified: Secondary | ICD-10-CM | POA: Insufficient documentation

## 2021-07-12 DIAGNOSIS — E039 Hypothyroidism, unspecified: Secondary | ICD-10-CM | POA: Insufficient documentation

## 2021-07-12 DIAGNOSIS — Z794 Long term (current) use of insulin: Secondary | ICD-10-CM | POA: Diagnosis not present

## 2021-07-12 DIAGNOSIS — C3490 Malignant neoplasm of unspecified part of unspecified bronchus or lung: Secondary | ICD-10-CM | POA: Insufficient documentation

## 2021-07-12 DIAGNOSIS — E119 Type 2 diabetes mellitus without complications: Secondary | ICD-10-CM | POA: Diagnosis not present

## 2021-07-12 DIAGNOSIS — H6980 Other specified disorders of Eustachian tube, unspecified ear: Secondary | ICD-10-CM | POA: Insufficient documentation

## 2021-07-12 DIAGNOSIS — IMO0002 Reserved for concepts with insufficient information to code with codable children: Secondary | ICD-10-CM | POA: Insufficient documentation

## 2021-07-12 DIAGNOSIS — M7741 Metatarsalgia, right foot: Secondary | ICD-10-CM | POA: Diagnosis not present

## 2021-07-12 MED ORDER — MELOXICAM 15 MG PO TABS: 15.0000 mg | ORAL_TABLET | Freq: Every day | ORAL | 0 refills | Status: DC

## 2021-07-12 NOTE — Progress Notes (Signed)
  Subjective:  Patient ID: Kelli Rojas, female    DOB: 1956-01-01,   MRN: [id number]  Chief Complaint  Patient presents with   Hammer Toe    I have a hammer toe on the 2nd toe right and feels like I am walking on trash and the left foot is fine and I do have some neuropathy    65 y.o. female presents for diabetic foot care. Relates pain in both of feet R>L. She has a history of neuropathy. Relates burning and tingling in the feet. Relates soreness in the feet as well particularly the right foot. Relates when she wears tight shoes she gets more pain. Also concerned about the spreading in her toes.  Last A1c was  7.4 on 07/08/21. Marland Kitchen Denies any other pedal complaints. Denies n/v/f/c.   PCP Reynold Bowen MD. Last seen 07/08/21  Past Medical History:  Diagnosis Date   Anxiety    Cancer ([place])    lung   Depression    Diabetes mellitus without complication ([place])    Diabetic peripheral neuropathy ([facility])    High cholesterol    Parkinson's disease ([place])    Thyroid disease     Objective:  Physical Exam: Vascular: DP/PT pulses 2/4 bilateral. CFT <3 seconds. Normal hair growth on digits. No edema.  Skin. No lacerations or abrasions bilateral feet.  Musculoskeletal: MMT 5/5 bilateral lower extremities in DF, PF, Inversion and Eversion. Deceased ROM in DF of ankle joint. Tender with medial lateral squeeze of metatarsals on right with negative mulder's click. Mild tenderness to plantar second and third metatarsals.  Neurological: Decreased sensation dorsally to second third and fifth right digits. Protective sensation intact.  Assessment:   1. Type 2 diabetes mellitus without complication, with long-term current use of insulin ([place])   2. Metatarsalgia, right foot      Plan:  Patient was evaluated and treated and all questions answered. -Discussed and educated patient on diabetic foot care, especially with  regards to the vascular, neurological and musculoskeletal systems.  -Stressed the  importance of good glycemic control and the detriment of not  controlling glucose levels in relation to the foot. -Discussed supportive shoes at all times and checking feet regularly.  Discussed metatarsalgia and treatment options with patient.  Discussed padding and offloading today.  Prescription for meloxicam provided.  Patient to return in 6 weeks or sooner if concerns arise.  -Patient advised to call the office if any problems or questions arise in the meantime.   Lorenda Peck, DPM

## 2021-08-09 ENCOUNTER — Ambulatory Visit: Payer: Medicare Other | Admitting: Cardiology

## 2021-08-29 ENCOUNTER — Other Ambulatory Visit: Payer: Self-pay

## 2021-08-29 ENCOUNTER — Ambulatory Visit (INDEPENDENT_AMBULATORY_CARE_PROVIDER_SITE_OTHER): Payer: Medicare Other | Admitting: Podiatry

## 2021-08-29 ENCOUNTER — Encounter: Payer: Self-pay | Admitting: Podiatry

## 2021-08-29 DIAGNOSIS — E119 Type 2 diabetes mellitus without complications: Secondary | ICD-10-CM

## 2021-08-29 DIAGNOSIS — G5761 Lesion of plantar nerve, right lower limb: Secondary | ICD-10-CM

## 2021-08-29 DIAGNOSIS — Z794 Long term (current) use of insulin: Secondary | ICD-10-CM

## 2021-08-29 MED ORDER — DEXAMETHASONE SODIUM PHOSPHATE 120 MG/30ML IJ SOLN
4.0000 mg | Freq: Once | INTRAMUSCULAR | Status: AC
  Administered 2021-08-29: 4 mg via INTRA_ARTICULAR

## 2021-08-29 MED ORDER — TRIAMCINOLONE ACETONIDE 10 MG/ML IJ SUSP
10.0000 mg | Freq: Once | INTRAMUSCULAR | Status: AC
Start: 2021-08-29 — End: 2021-08-29
  Administered 2021-08-29: 10 mg

## 2021-08-29 NOTE — Progress Notes (Signed)
  Subjective:  Patient ID: Kelli Rojas, female    DOB: 1955-10-15,   MRN: [id number]  No chief complaint on file.   65 y.o. female presents for follow -up of right foot pain and metatarsalgia. Relates she did not take the meloxicam because she was worried about the side effects.  Her family has a history of heart disease and stroke. Relates she has been doing the padding without much relief. She has a history of neuropathy. Relates burning and tingling in the feet.   Last A1c was  8 on 08/29/21. Marland Kitchen Denies any other pedal complaints. Denies n/v/f/c.   PCP Reynold Bowen MD. Last seen 07/08/21  Past Medical History:  Diagnosis Date   Anxiety    Cancer ([place])    lung   Depression    Diabetes mellitus without complication ([place])    Diabetic peripheral neuropathy ([facility])    High cholesterol    Parkinson's disease ([place])    Thyroid disease     Objective:  Physical Exam: Vascular: DP/PT pulses 2/4 bilateral. CFT <3 seconds. Normal hair growth on digits. No edema.  Skin. No lacerations or abrasions bilateral feet.  Musculoskeletal: MMT 5/5 bilateral lower extremities in DF, PF, Inversion and Eversion. Deceased ROM in DF of ankle joint. Tender with medial lateral squeeze of metatarsals on right with negative mulder's click. Mild tenderness to plantar second and third metatarsals. Pain in third interspace is worst  Neurological: Decreased sensation dorsally to second third and fifth right digits. Protective sensation intact.  Assessment:   1. Morton's metatarsalgia, neuralgia, or neuroma, right   2. Type 2 diabetes mellitus without complication, with long-term current use of insulin ([place])       Plan:  Patient was evaluated and treated and all questions answered. -Discussed and educated patient on diabetic foot care, especially with  regards to the vascular, neurological and musculoskeletal systems.  -Stressed the importance of good glycemic control and the detriment of not  controlling  glucose levels in relation to the foot. -Discussed supportive shoes at all times and checking feet regularly.  Discussed metatarsalgia and mortons neuroma and treatment options with patient.  Discussed padding and offloading today. Discussed custom orthotics and patient will think about them.  Injection recommended today. Procedure note below.  Patient to return in 6 weeks or sooner if concerns arise.  -Patient advised to call the office if any problems or questions arise in the meantime.   Procedure: Injection Tendon/Ligament Discussed alternatives, risks, complications and verbal consent was obtained.  Location: Right third interspace. Skin Prep: Alcohol. Injectate: 1cc 0.5% marcaine plain, 1 cc dexamethasone 1 cc kenalog 10.  Disposition: Patient tolerated procedure well. Injection site dressed with a band-aid.  Post-injection care was discussed and return precautions discussed.    Lorenda Peck, DPM

## 2021-10-01 NOTE — Progress Notes (Signed)
Subjective:   Kelli Rojas, female    DOB: 1956/05/11, 66 y.o.   MRN: [id number]    Chief complaint:  Coronary artery disease  HPI  66 year old Caucasian female with mild nonobstructive coronary artery disease, type II diabetes mellitus, hyperlipidemia, strong family history of premature CAD, former smoker, lung cancer status post right lobectomy.  Patient denies any recent chest pain, but has experienced exertional dyspnea. More significantly, she has felt lightheaded with standing up for last 2-3 moths. She has not been evaluated for this. She denies syncope.   Current Outpatient Medications on File Prior to Visit  Medication Sig Dispense Refill   aspirin 81 MG tablet Take 81 mg by mouth daily.     atorvastatin (LIPITOR) 40 MG tablet TAKE 1 TABLET DAILY 90 tablet 3   BD PEN NEEDLE NANO 2ND GEN 32G X 4 MM MISC      buPROPion (WELLBUTRIN) 75 MG tablet Take 75 mg by mouth 2 (two) times daily.     Calcium Carbonate-Vit D-Min (CALCIUM 1200 PO) Take 1 tablet by mouth daily.     Continuous Blood Gluc Sensor (FREESTYLE LIBRE 2 SENSOR) MISC USE TO MONITOR BLOOD GLUCOSE CONTINUOUSLY AS DIRECTED. CHANGE EVERY 14 DAYS     DULoxetine (CYMBALTA) 60 MG capsule      gabapentin (NEURONTIN) 300 MG capsule One tab PO qHS for a week, then BID for a week, then TID. May double weekly to a max of 3,644m/day 180 capsule 3   HUMALOG KWIKPEN 100 UNIT/ML KwikPen Inject into the skin.     JARDIANCE 25 MG TABS tablet      Lancets (FREESTYLE) lancets SMARTSIG:1 Each Topical 4 Times Daily     LANTUS SOLOSTAR 100 UNIT/ML Solostar Pen 30 Units every morning.     levothyroxine (SYNTHROID) 137 MCG tablet Take 137 mcg by mouth daily before breakfast.     meloxicam (MOBIC) 15 MG tablet Take 1 tablet (15 mg total) by mouth daily. 30 tablet 0   metoprolol succinate (TOPROL-XL) 25 MG 24 hr tablet TAKE 1 TABLET DAILY 90 tablet 3   NITROSTAT 0.4 MG SL tablet Place 0.4 mg under the tongue every 5 (five) minutes x 3  doses as needed.  0   Omega-3 Fatty Acids (FISH OIL) 1000 MG CAPS Take 1,000 mg by mouth 2 (two) times daily.      omeprazole (PRILOSEC) 40 MG capsule Take 40 mg by mouth daily.     Semaglutide (OZEMPIC, 0.25 OR 0.5 MG/DOSE, [place]) Inject into the skin.     temazepam (RESTORIL) 30 MG capsule Take 30 mg by mouth at bedtime as needed for sleep.     No current facility-administered medications on file prior to visit.    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 Dominant  Echocardiogram 12/21/2014: 1. Left ventricle cavity is normal in size. Normal global wall motion. Doppler evidence of grade I (impaired) diastolic dysfunction. Calculated EF 66%. 2. Mild mitral regurgitation. 3. Trace tricuspid regurgitation  Lexiscan sestamibi stress test 12/01/2014:  1. The resting electrocardiogram demonstrated normal sinus rhythm, normal resting conduction, Poor R progression, no resting arrhythmias and nonspecific T inversionchanges in inferior and lateral leads.  Stress EKG is nondiagnostic for ischemia as it is a Pharmacologic stress test using Lexiscan infusion. Stress symptoms included dyspnea, dizziness. 2.  The T.I.D. is 1.49 and suggests transient ischemic dilatation. Perfusion imaging study demonstrates possible septal ischemia, small sized.  The sum difference on polar plot images was only 2.  The left ventricular systolic function Calculated by QGS was 59%, there was no wall motion abnormality.  Overall this represents an intermediate risk, clinical correlation is recommended.  Recent labs: 12/10/2020: Glucose N/A, BUN/Cr 12/0.7. EGFR 62.  HbA1C 8.3% Chol 133, TG 77, HDL 48, LDL 70 TSH 1.4 normal  04/08/2019: Glucose 123, BUN/Cr 13/0.8. EGFR 72 HbA1C  7.0% Chol 130, TG 86, HDL 37, LDL 76 TSH 0.08 ?low    Review of Systems  Cardiovascular:  Negative for chest pain, dyspnea on exertion, leg swelling, palpitations and syncope.  Neurological:  Positive for dizziness.       Orthostatic VS for the past 72 hrs (Last 3 readings):  Orthostatic BP Patient Position BP Location Cuff Size Orthostatic Pulse  10/02/21 1126 99/62 Standing Left Arm Normal 116  10/02/21 1125 112/58 Sitting Left Arm Large 84  10/02/21 1118 124/59 Supine Left Arm Large 77     Vitals:   10/02/21 1125 10/02/21 1126  Resp:    Temp:    SpO2: 96% 95%    Objective:   Physical Exam Vitals and nursing note reviewed.  Constitutional:      Appearance: She is well-developed.  Neck:     Vascular: No JVD.  Cardiovascular:     Rate and Rhythm: Normal rate and regular rhythm.     Pulses: Intact distal pulses.     Heart sounds: Normal heart sounds. No murmur heard. Pulmonary:     Effort: Pulmonary effort is normal.     Breath sounds: Normal breath sounds. No wheezing or rales.  Musculoskeletal:     Right lower leg: No edema.     Left lower leg: No edema.          Assessment & Recommendations:   66 year old Caucasian female with mild nonobstructive coronary artery disease, type II diabetes mellitus, hyperlipidemia, strong family history of premature CAD, former smoker, lung cancer status post right lobectomy.  Orthostatic hypotension: Significantly orthostatic today. She reports being thirsty and drinking a lot of water throughout the day. Recommend stopping metoprolol and recheck for orthostatics in 4 weeks. If she remains orthostatic, may need to consider workup for adrenal insufficiency. Defer this to Dr. Baldwin Crown office.   CAD: Nonobstructive CAD on cath in 2016 Continue aspirin, lipitor, fish oil, metoprolol Given recent exertional dyspnea, will check echocardiogram  Hypertension: Well controlled.  Hyperlipidemia: Controlled  Type 2  DM: Continue follow-up with Dr. Forde Dandy.  F/u in 4 weeks  [place], MD [facility]. [place] Pager: [phone] Office: [phone] If no answer Cell [phone]

## 2021-10-02 ENCOUNTER — Encounter: Payer: Self-pay | Admitting: Cardiology

## 2021-10-02 ENCOUNTER — Other Ambulatory Visit: Payer: Self-pay

## 2021-10-02 ENCOUNTER — Ambulatory Visit: Payer: Medicare Other | Admitting: Cardiology

## 2021-10-02 VITALS — Temp 98.0°F | Resp 16 | Ht 64.0 in | Wt 185.0 lb

## 2021-10-02 DIAGNOSIS — I2510 Atherosclerotic heart disease of native coronary artery without angina pectoris: Secondary | ICD-10-CM

## 2021-10-02 DIAGNOSIS — R0609 Other forms of dyspnea: Secondary | ICD-10-CM

## 2021-10-02 DIAGNOSIS — E119 Type 2 diabetes mellitus without complications: Secondary | ICD-10-CM

## 2021-10-02 DIAGNOSIS — E782 Mixed hyperlipidemia: Secondary | ICD-10-CM

## 2021-10-02 DIAGNOSIS — R42 Dizziness and giddiness: Secondary | ICD-10-CM

## 2021-10-02 DIAGNOSIS — I10 Essential (primary) hypertension: Secondary | ICD-10-CM

## 2021-10-10 ENCOUNTER — Other Ambulatory Visit: Payer: Self-pay

## 2021-10-10 ENCOUNTER — Ambulatory Visit: Payer: Medicare Other

## 2021-10-10 DIAGNOSIS — R0609 Other forms of dyspnea: Secondary | ICD-10-CM

## 2021-10-17 ENCOUNTER — Encounter: Payer: Self-pay | Admitting: Podiatry

## 2021-10-17 ENCOUNTER — Ambulatory Visit (INDEPENDENT_AMBULATORY_CARE_PROVIDER_SITE_OTHER): Payer: Medicare Other | Admitting: Podiatry

## 2021-10-17 ENCOUNTER — Ambulatory Visit (INDEPENDENT_AMBULATORY_CARE_PROVIDER_SITE_OTHER): Payer: Medicare Other

## 2021-10-17 ENCOUNTER — Other Ambulatory Visit: Payer: Self-pay

## 2021-10-17 DIAGNOSIS — M7741 Metatarsalgia, right foot: Secondary | ICD-10-CM

## 2021-10-17 DIAGNOSIS — L603 Nail dystrophy: Secondary | ICD-10-CM

## 2021-10-17 DIAGNOSIS — G5761 Lesion of plantar nerve, right lower limb: Secondary | ICD-10-CM

## 2021-10-17 DIAGNOSIS — Z794 Long term (current) use of insulin: Secondary | ICD-10-CM

## 2021-10-17 DIAGNOSIS — M79674 Pain in right toe(s): Secondary | ICD-10-CM | POA: Diagnosis not present

## 2021-10-17 DIAGNOSIS — E119 Type 2 diabetes mellitus without complications: Secondary | ICD-10-CM

## 2021-10-17 NOTE — Progress Notes (Signed)
°  Subjective:  Patient ID: Kelli Rojas, female    DOB: December 25, 1955,   MRN: [id number]  Chief Complaint  Patient presents with   Toe Pain    Pt is here to f/u right toe nail pain. Also mention bruise on 2nd and 3 the toe.      66 y.o. female presents for follow -up of right foot pain and metatarsalgia. Relates injection helped last time and is about 90 % better. Does have a new problem with pain in the right hallux with pressing of the nail. Denies any treatment. Does relate a history of tumor underneath a finger nail and was concerned.  She has a history of neuropathy. Relates burning and tingling in the feet.   Last A1c was  8 on 08/29/21. Marland Kitchen Denies any other pedal complaints. Denies n/v/f/c.   PCP Reynold Bowen MD.   Past Medical History:  Diagnosis Date   Anxiety    Cancer ([place])    lung   Depression    Diabetes mellitus without complication ([place])    Diabetic peripheral neuropathy (HCC)    High cholesterol    Parkinson's disease ([place])    Thyroid disease     Objective:  Physical Exam: Vascular: DP/PT pulses 2/4 bilateral. CFT <3 seconds. Normal hair growth on digits. No edema.  Skin. No lacerations or abrasions bilateral feet. Mild incurvation noted to bilateral borders of right hallux.  Musculoskeletal: MMT 5/5 bilateral lower extremities in DF, PF, Inversion and Eversion. Deceased ROM in DF of ankle joint. Pain relieved in third interspace no pain to palpation. No pain with metatarsal squeeze. Does relate some new pain under the right hallux nail distally.  Neurological: Decreased sensation dorsally to second third and fifth right digits. Protective sensation intact.  Assessment:   1. Metatarsalgia, right foot   2. Onychodystrophy   3. Morton's metatarsalgia, neuralgia, or neuroma, right   4. Type 2 diabetes mellitus without complication, with long-term current use of insulin ([place])       Plan:  Patient was evaluated and treated and all questions answered. -Discussed  and educated patient on diabetic foot care, especially with  regards to the vascular, neurological and musculoskeletal systems.  -Stressed the importance of good glycemic control and the detriment of not  controlling glucose levels in relation to the foot. -Discussed supportive shoes at all times and checking feet regularly.  Discussed metatarsalgia and mortons neuroma and treatment options with patient.  Discussed ingrown nails and bony prominences. Will get X-ray to rule out any exostosis of right great toe.  -X-rays reviewed. No acute fractures or dislocations.  Continue with padding.  Discussed trimming nail farther back to help with pain. .  -Patient advised to call the office if any problems or questions arise      Lorenda Peck, DPM

## 2021-10-31 ENCOUNTER — Ambulatory Visit: Payer: Medicare Other | Admitting: Cardiology

## 2021-10-31 ENCOUNTER — Other Ambulatory Visit: Payer: Self-pay

## 2021-10-31 ENCOUNTER — Encounter: Payer: Self-pay | Admitting: Cardiology

## 2021-10-31 VITALS — Temp 97.8°F | Resp 16 | Ht 64.0 in | Wt 182.0 lb

## 2021-10-31 DIAGNOSIS — R42 Dizziness and giddiness: Secondary | ICD-10-CM

## 2021-10-31 DIAGNOSIS — I951 Orthostatic hypotension: Secondary | ICD-10-CM | POA: Insufficient documentation

## 2021-10-31 NOTE — Progress Notes (Signed)
Subjective:   Kelli Rojas, female    DOB: 24-Sep-1956, 66 y.o.   MRN: [id number]    Chief complaint:  Coronary artery disease  HPI  66 year old Caucasian female with mild nonobstructive coronary artery disease, type II diabetes mellitus, hyperlipidemia, strong family history of premature CAD, former smoker, lung cancer status post right lobectomy.  Reviewed echocardiogram results with the patient, details below. Her symptoms of lightheadedness have improved after stopping metoprolol. She is only wearing compression stockings when she walks. Patient did get lightheaded today during orthostatics vitals check, but recovered quickly.   Current Outpatient Medications on File Prior to Visit  Medication Sig Dispense Refill   aspirin 81 MG tablet Take 81 mg by mouth daily.     atorvastatin (LIPITOR) 40 MG tablet TAKE 1 TABLET DAILY 90 tablet 3   BD PEN NEEDLE NANO 2ND GEN 32G X 4 MM MISC      buPROPion (WELLBUTRIN) 75 MG tablet Take 75 mg by mouth 2 (two) times daily.     Calcium Carbonate-Vit D-Min (CALCIUM 1200 PO) Take 1 tablet by mouth daily.     Continuous Blood Gluc Sensor (FREESTYLE LIBRE 2 SENSOR) MISC USE TO MONITOR BLOOD GLUCOSE CONTINUOUSLY AS DIRECTED. CHANGE EVERY 14 DAYS     DULoxetine (CYMBALTA) 60 MG capsule      gabapentin (NEURONTIN) 300 MG capsule One tab PO qHS for a week, then BID for a week, then TID. May double weekly to a max of 3,653m/day 180 capsule 3   HUMALOG KWIKPEN 100 UNIT/ML KwikPen Inject into the skin.     JARDIANCE 25 MG TABS tablet      Lancets (FREESTYLE) lancets SMARTSIG:1 Each Topical 4 Times Daily     LANTUS SOLOSTAR 100 UNIT/ML Solostar Pen 30 Units every morning.     levothyroxine (SYNTHROID) 137 MCG tablet Take 137 mcg by mouth daily before breakfast.     meloxicam (MOBIC) 15 MG tablet Take 1 tablet (15 mg total) by mouth daily. 30 tablet 0   NITROSTAT 0.4 MG SL tablet Place 0.4 mg under the tongue every 5 (five) minutes x 3 doses as needed.  0    Omega-3 Fatty Acids (FISH OIL) 1000 MG CAPS Take 1,000 mg by mouth 2 (two) times daily.      omeprazole (PRILOSEC) 40 MG capsule Take 40 mg by mouth daily.     Semaglutide (OZEMPIC, 0.25 OR 0.5 MG/DOSE, ) Inject into the skin.     temazepam (RESTORIL) 30 MG capsule Take 30 mg by mouth at bedtime as needed for sleep.     Vitamin D, Ergocalciferol, (DRISDOL) 1.25 MG (50000 UNIT) CAPS capsule TAKE 1 CAPSULE  ONCE   AS DIRECTED     No current facility-administered medications on file prior to visit.    Cardiovascular studies:  Echocardiogram 10/10/2021:  Normal LV systolic function with visual EF 60-65%. Left ventricle cavity  is normal in size. Normal left ventricular wall thickness. Normal global  wall motion. Normal diastolic filling pattern, normal LAP.  Trace tricuspid regurgitation. No evidence of pulmonary hypertension.  No significant valvular heart disease.  Compared to study 12/21/2014 LVEF remains preserved, G1DD and Mild MR now  improved.   EKG 10/02/2021: Sinus rhythm 77 bpm Low voltage in precordial leads  Old anteroseptal infarct Nonspecific T-abnormality  Coronary angiography 01/02/2015: LM: Normal LAD: ostial 10%, mid 10%, septal perforator 70% ostial stenoses LCx: Normal RCA: Normal. Dominant  Recent labs: 12/10/2020: Glucose N/A, BUN/Cr 12/0.7. EGFR 62.  HbA1C 8.3%  Chol 133, TG 77, HDL 48, LDL 70 TSH 1.4 normal  04/08/2019: Glucose 123, BUN/Cr 13/0.8. EGFR 72 HbA1C 7.0% Chol 130, TG 86, HDL 37, LDL 76 TSH 0.08 ?low    Review of Systems  Cardiovascular:  Negative for chest pain, dyspnea on exertion, leg swelling, palpitations and syncope.  Neurological:  Positive for light-headedness (Improved). Negative for dizziness.       Orthostatic VS for the past 72 hrs (Last 3 readings):  Orthostatic BP Patient Position BP Location Cuff Size Orthostatic Pulse  10/31/21 1110 110/65 Standing Left Arm Large 110  10/31/21 1109 119/74 Sitting Left Arm Large 96   10/31/21 1105 121/77 Supine Left Arm Large 81     Vitals:   10/31/21 1109 10/31/21 1110  Resp:    Temp:    SpO2: 95% 93%    Objective:   Physical Exam Vitals and nursing note reviewed.  Constitutional:      Appearance: She is well-developed.  Neck:     Vascular: No JVD.  Cardiovascular:     Rate and Rhythm: Normal rate and regular rhythm.     Pulses: Intact distal pulses.     Heart sounds: Normal heart sounds. No murmur heard. Pulmonary:     Effort: Pulmonary effort is normal.     Breath sounds: Normal breath sounds. No wheezing or rales.  Musculoskeletal:     Right lower leg: No edema.     Left lower leg: No edema.          Assessment & Recommendations:   66 year old Caucasian female with mild nonobstructive coronary artery disease, type II diabetes mellitus, hyperlipidemia, strong family history of premature CAD, former smoker, lung cancer status post right lobectomy.  Orthostatic hypotension: Improved. HR increased on standing up, almost tin the POTS range. However, she is feeling better overall after stopping metoprolol. Continue liberal hydration and regular use of compression stockings.  CAD: Nonobstructive CAD on cath in 2016 Continue aspirin, lipitor, fish oil, metoprolol  Hypertension: Well controlled.  Hyperlipidemia: Controlled   Type 2 DM: Continue follow-up with Dr. Forde Dandy.  F/u in 3 months  Hart, MD [facility] Pager: [phone] Office: [phone] If no answer Cell [phone]

## 2021-11-12 ENCOUNTER — Encounter: Payer: Self-pay | Admitting: Cardiology

## 2021-12-27 ENCOUNTER — Encounter: Payer: Self-pay | Admitting: Podiatry

## 2021-12-27 ENCOUNTER — Ambulatory Visit (INDEPENDENT_AMBULATORY_CARE_PROVIDER_SITE_OTHER): Payer: Medicare Other | Admitting: Podiatry

## 2021-12-27 DIAGNOSIS — M7741 Metatarsalgia, right foot: Secondary | ICD-10-CM

## 2021-12-27 DIAGNOSIS — E119 Type 2 diabetes mellitus without complications: Secondary | ICD-10-CM

## 2021-12-27 DIAGNOSIS — G5761 Lesion of plantar nerve, right lower limb: Secondary | ICD-10-CM

## 2021-12-27 DIAGNOSIS — Z794 Long term (current) use of insulin: Secondary | ICD-10-CM

## 2021-12-27 MED ORDER — DEXAMETHASONE SODIUM PHOSPHATE 120 MG/30ML IJ SOLN: 4.0000 mg | Freq: Once | INTRAMUSCULAR | Status: AC

## 2021-12-27 NOTE — Progress Notes (Signed)
?  Subjective:  ?Patient ID: Kelli Rojas, female    DOB: Aug 02, 1956,   MRN: [id number] ? ?Chief Complaint  ?Patient presents with  ? Diabetes  ?  right foot neurpathy  ? ? ? ?66 y.o. female presents today for concern of neuropathy and neuroma in her toe. Relates numbness in her toes .  Relates injection helped last time and worked for a few months but relates the pain has returned and hoping for another injection.  Relates burning and tingling in the feet.   Last A1c was  8 on 08/29/21. Marland Kitchen Denies any other pedal complaints. Denies n/v/f/c.  ? ?PCP Reynold Bowen MD.  ? ?Past Medical History:  ?Diagnosis Date  ? Anxiety   ? Cancer [facility])   ? lung  ? Depression   ? Diabetes mellitus without complication ([place])   ? Diabetic peripheral neuropathy ([place])   ? High cholesterol   ? Parkinson's disease ([place])   ? Thyroid disease   ? ? ?Objective:  ?Physical Exam: ?Vascular: DP/PT pulses 2/4 bilateral. CFT <3 seconds. Normal hair growth on digits. No edema.  ?Skin. No lacerations or abrasions bilateral feet. Mild incurvation noted to bilateral borders of right hallux.  ?Musculoskeletal: MMT 5/5 bilateral lower extremities in DF, PF, Inversion and Eversion. Deceased ROM in DF of ankle joint. Pain  in third interspace and pain to palpation. Pain with metatarsal squeeze. ?Neurological: Decreased sensation dorsally to second third and fifth right digits. Protective sensation intact. ? ?Assessment:  ? ?1. Morton's metatarsalgia, neuralgia, or neuroma, right   ?2. Metatarsalgia, right foot   ?3. Type 2 diabetes mellitus without complication, with long-term current use of insulin ([place])   ? ? ? ? ? ?Plan:  ? ?Patient was evaluated and treated and all questions answered. ?-Discussed and educated patient on diabetic foot care, especially with  ?regards to the vascular, neurological and musculoskeletal systems.  ?-Stressed the importance of good glycemic control and the detriment of not  ?controlling glucose levels in relation to the  foot. ?-Discussed supportive shoes at all times and checking feet regularly.  ?Discussed metatarsalgia and mortons neuroma and treatment options with patient.  ?Discussed padding and offloading today. Discussed custom orthotics and patient will think about them.  ?Injection recommended today. Procedure note below.  ?Patient to return as needed.  ?  ?  ?Procedure: Injection Tendon/Ligament ?Discussed alternatives, risks, complications and verbal consent was obtained.  ?Location: Right third interspace. ?Skin Prep: Alcohol. ?Injectate: 1cc 0.5% marcaine plain, 1 cc dexamethasone 1 cc kenalog 10.  ?Disposition: Patient tolerated procedure well. Injection site dressed with a band-aid.  ?Post-injection care was discussed and return precautions discussed.  ? ? ? ?Lorenda Peck, DPM  ? ? ?

## 2022-01-28 ENCOUNTER — Encounter: Payer: Self-pay | Admitting: Cardiology

## 2022-01-28 ENCOUNTER — Ambulatory Visit: Payer: Medicare Other | Admitting: Cardiology

## 2022-01-28 VITALS — BP 105/58 | HR 96 | Temp 98.0°F | Resp 16 | Ht 64.0 in | Wt 170.0 lb

## 2022-01-28 DIAGNOSIS — I2510 Atherosclerotic heart disease of native coronary artery without angina pectoris: Secondary | ICD-10-CM

## 2022-01-28 DIAGNOSIS — I10 Essential (primary) hypertension: Secondary | ICD-10-CM

## 2022-01-28 DIAGNOSIS — I951 Orthostatic hypotension: Secondary | ICD-10-CM

## 2022-01-28 DIAGNOSIS — E782 Mixed hyperlipidemia: Secondary | ICD-10-CM

## 2022-01-28 DIAGNOSIS — E119 Type 2 diabetes mellitus without complications: Secondary | ICD-10-CM

## 2022-01-28 NOTE — Progress Notes (Signed)
? ? ?Subjective:  ? ?Kelli Rojas, female    DOB: June 20, 1956, 66 y.o.   MRN: [id number] ? ? ? ?Chief complaint:  ?Coronary artery disease ? ?HPI ? ?66 year old Caucasian female with mild nonobstructive coronary artery disease, type II diabetes mellitus, hyperlipidemia, strong family history of premature CAD, former smoker, lung cancer status post right lobectomy. ? ?Patient is doing well. She has not had any recurrent lightheadedness. ? ? ?Current Outpatient Medications:  ?  aspirin 81 MG tablet, Take 81 mg by mouth daily., Disp: , Rfl:  ?  atorvastatin (LIPITOR) 40 MG tablet, TAKE 1 TABLET DAILY, Disp: 90 tablet, Rfl: 3 ?  BD PEN NEEDLE NANO 2ND GEN 32G X 4 MM MISC, , Disp: , Rfl:  ?  Calcium Carbonate-Vit D-Min (CALCIUM 1200 PO), Take 1 tablet by mouth daily., Disp: , Rfl:  ?  Continuous Blood Gluc Sensor (FREESTYLE LIBRE 2 SENSOR) MISC, USE TO MONITOR BLOOD GLUCOSE CONTINUOUSLY AS DIRECTED. CHANGE EVERY 14 DAYS, Disp: , Rfl:  ?  DULoxetine (CYMBALTA) 60 MG capsule, , Disp: , Rfl:  ?  gabapentin (NEURONTIN) 300 MG capsule, One tab PO qHS for a week, then BID for a week, then TID. May double weekly to a max of 3,698m/day, Disp: 180 capsule, Rfl: 3 ?  HUMALOG KWIKPEN 100 UNIT/ML KwikPen, Inject into the skin., Disp: , Rfl:  ?  JARDIANCE 25 MG TABS tablet, , Disp: , Rfl:  ?  Lancets (FREESTYLE) lancets, SMARTSIG:1 Each Topical 4 Times Daily, Disp: , Rfl:  ?  LANTUS SOLOSTAR 100 UNIT/ML Solostar Pen, 30 Units every morning., Disp: , Rfl:  ?  levothyroxine (SYNTHROID) 137 MCG tablet, Take 137 mcg by mouth daily before breakfast., Disp: , Rfl:  ?  NITROSTAT 0.4 MG SL tablet, Place 0.4 mg under the tongue every 5 (five) minutes x 3 doses as needed., Disp: , Rfl: 0 ?  Omega-3 Fatty Acids (FISH OIL) 1000 MG CAPS, Take 1,000 mg by mouth 2 (two) times daily. , Disp: , Rfl:  ?  omeprazole (PRILOSEC) 40 MG capsule, Take 40 mg by mouth daily., Disp: , Rfl:  ?  Semaglutide (OZEMPIC, 0.25 OR 0.5 MG/DOSE, [place]), Inject into the  skin., Disp: , Rfl:  ?  temazepam (RESTORIL) 30 MG capsule, Take 30 mg by mouth at bedtime as needed for sleep., Disp: , Rfl:  ?  Vitamin D, Ergocalciferol, (DRISDOL) 1.25 MG (50000 UNIT) CAPS capsule, TAKE 1 CAPSULE  ONCE   AS DIRECTED, Disp: , Rfl:  ? ?Current Facility-Administered Medications:  ?  dexamethasone (DECADRON) injection 4 mg, 4 mg, Intra-articular, Once, SLorenda Peck DPM ? ?Cardiovascular studies: ? ?Echocardiogram 10/10/2021:  ?Normal LV systolic function with visual EF 60-65%. Left ventricle cavity  ?is normal in size. Normal left ventricular wall thickness. Normal global  ?wall motion. Normal diastolic filling pattern, normal LAP.  ?Trace tricuspid regurgitation. No evidence of pulmonary hypertension.  ?No significant valvular heart disease.  ?Compared to study 12/21/2014 LVEF remains preserved, G1DD and Mild MR now  ?improved.  ? ?EKG 10/02/2021: ?Sinus rhythm 77 bpm ?Low voltage in precordial leads  ?Old anteroseptal infarct ?Nonspecific T-abnormality ? ?Coronary angiography 01/02/2015: ?LM: Normal ?LAD: ostial 10%, mid 10%, septal perforator 70% ostial stenoses ?LCx: Normal ?RCA: Normal. Dominant ? ?Recent labs: ?12/10/2020: ?Glucose N/A, BUN/Cr 12/0.7. EGFR 62.  ?HbA1C 8.3% ?Chol 133, TG 77, HDL 48, LDL 70 ?TSH 1.4 normal ? ?04/08/2019: ?Glucose 123, BUN/Cr 13/0.8. EGFR 72 ?HbA1C 7.0% ?Chol 130, TG 86, HDL 37, LDL 76 ?TSH 0.08 ?  low  ? ? ?Review of Systems  ?Cardiovascular:  Negative for chest pain, dyspnea on exertion, leg swelling, palpitations and syncope.  ?Neurological:  Positive for light-headedness (Improved). Negative for dizziness.  ? ?   ? ?No data found. ? ? ? ?There were no vitals filed for this visit. ? ? ?Objective:  ? Physical Exam ?Vitals and nursing note reviewed.  ?Constitutional:   ?   Appearance: She is well-developed.  ?Neck:  ?   Vascular: No JVD.  ?Cardiovascular:  ?   Rate and Rhythm: Normal rate and regular rhythm.  ?   Pulses: Intact distal pulses.  ?   Heart sounds:  Normal heart sounds. No murmur heard. ?Pulmonary:  ?   Effort: Pulmonary effort is normal.  ?   Breath sounds: Normal breath sounds. No wheezing or rales.  ?Musculoskeletal:  ?   Right lower leg: No edema.  ?   Left lower leg: No edema.  ? ? ? ?  ICD-10-CM   ?1. Orthostatic hypotension  I95.1   ?  ?2. Coronary artery disease involving native coronary artery of native heart without angina pectoris  I25.10   ?  ?3. Essential hypertension  I10   ?  ?4. Type 2 diabetes mellitus without complication, with long-term current use of insulin (HCC)  E11.9   ? Z79.4   ?  ?5. Mixed hyperlipidemia  E78.2   ?  ? ? ? ?   ?Assessment & Recommendations:  ? ?66 year old Caucasian female with mild nonobstructive coronary artery disease, type II diabetes mellitus, hyperlipidemia, strong family history of premature CAD, former smoker, lung cancer status post right lobectomy. ? ?Orthostatic hypotension: ?Likely due to metoprolol. Now resolved. ? ?CAD: ?Nonobstructive CAD on cath in 2016 ?Continue aspirin, lipitor, fish oil, metoprolol ? ?Hypertension: ?Well controlled. ? ?Hyperlipidemia: ?Controlled ? ?Type 2 DM: ?Continue follow-up with Dr. Forde Dandy. ? ?F/u in 1 year ? ?Nigel Mormon, MD ?[facility] ?Pager: [phone] ?Office: [phone] ?If no answer Cell [phone] ?   ? ?

## 2022-01-29 ENCOUNTER — Ambulatory Visit: Payer: Medicare Other | Admitting: Cardiology

## 2022-02-03 ENCOUNTER — Other Ambulatory Visit: Payer: Self-pay | Admitting: Cardiology

## 2022-03-12 ENCOUNTER — Institutional Professional Consult (permissible substitution): Payer: Medicare Other | Admitting: Pulmonary Disease

## 2022-03-18 ENCOUNTER — Ambulatory Visit (INDEPENDENT_AMBULATORY_CARE_PROVIDER_SITE_OTHER): Payer: Medicare Other | Admitting: Pulmonary Disease

## 2022-03-18 ENCOUNTER — Encounter: Payer: Self-pay | Admitting: Pulmonary Disease

## 2022-03-18 VITALS — BP 118/76 | HR 87 | Temp 98.6°F | Ht 64.0 in | Wt 159.8 lb

## 2022-03-18 DIAGNOSIS — R0609 Other forms of dyspnea: Secondary | ICD-10-CM

## 2022-03-18 MED ORDER — UMECLIDINIUM-VILANTEROL 62.5-25 MCG/ACT IN AEPB: 1.0000 | INHALATION_SPRAY | Freq: Every day | RESPIRATORY_TRACT | 3 refills | Status: DC

## 2022-03-18 MED ORDER — ALBUTEROL SULFATE HFA 108 (90 BASE) MCG/ACT IN AERS: 2.0000 | INHALATION_SPRAY | Freq: Four times a day (QID) | RESPIRATORY_TRACT | 6 refills | Status: AC | PRN

## 2022-03-18 NOTE — Patient Instructions (Signed)
Schedule for pulmonary function test Can be done on the day of next visit  I will see you in about 4 to 6 weeks  Prescription for Anoro to be used daily Prescription for albuterol to be used as needed  Regular walking Graded exercises as tolerated  Call with significant concerns  Continue follow-up with oncology  Continue weight loss efforts

## 2022-03-18 NOTE — Progress Notes (Signed)
Kelli Rojas    [id number]    07-24-1956  Primary Care Physician:South, Annie Main, MD  Referring Physician: Reynold Bowen, [address] [place],  [place] [address]  Chief complaint:   Patient is being seen for shortness of breath on exertion  HPI:  History of lung cancer for which she had wedge resection of right lower lobe in 2011 Bronchoalveolar carcinoma of right lung in 2011 Quit smoking in 2011  Diagnosed with mild obstructive sleep apnea in 2019 Did receive his CPAP but not able to tolerate CPAP  Has chronic insomnia for which she uses temazepam -This continues to help her get good enough sleep  She has managed to lose about 40 pounds this year on Mounjaro  Usually goes to bed about 12 midnight, falls asleep in about 10 minutes About 1 awakening Wake up time about noon  She does have a history of diabetes, hypercholesterolemia  Has not recently been on any inhalers but did use an inhaler in the past, does not recollect whether it helped  She smoked 1 pack a day up until when she quit in 2011  She did office work, 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   aspirin 81 MG tablet Take 81 mg by mouth daily.   atorvastatin (LIPITOR) 40 MG tablet TAKE 1 TABLET DAILY   BD PEN NEEDLE NANO 2ND GEN 32G X 4 MM MISC    Calcium Carbonate-Vit D-Min (CALCIUM 1200 PO) Take 1 tablet by mouth daily.   Chromium Picolinate 1000 MCG TABS Take 1 tablet by mouth daily.   Continuous Blood Gluc  Sensor (FREESTYLE LIBRE 2 SENSOR) MISC USE TO MONITOR BLOOD GLUCOSE CONTINUOUSLY AS DIRECTED. CHANGE EVERY 14 DAYS   DULoxetine (CYMBALTA) 60 MG capsule    gabapentin (NEURONTIN) 300 MG capsule One tab PO qHS for a week, then BID for a week, then TID. May double weekly to a max of 3,600mg /day   HUMALOG KWIKPEN 100 UNIT/ML KwikPen Inject into the skin.   JARDIANCE 25 MG TABS tablet    Lancets (FREESTYLE) lancets SMARTSIG:1 Each Topical 4 Times Daily   LANTUS SOLOSTAR 100 UNIT/ML Solostar Pen 30 Units every morning.   levothyroxine (SYNTHROID) 137 MCG tablet Take 137 mcg by mouth daily before breakfast.   Menaquinone-7 (VITAMIN K2) 100 MCG CAPS Take by mouth.   MOUNJARO 12.5 MG/0.5ML Pen SMARTSIG:12.5 Milligram(s) SUB-Q Once a Week   NITROSTAT 0.4 MG SL tablet Place 0.4 mg under the tongue every 5 (five) minutes x 3 doses as needed.   Omega-3 Fatty Acids (FISH OIL) 1000 MG CAPS Take 1,000 mg by mouth 2 (two) times daily.    omeprazole (PRILOSEC) 40 MG capsule Take 40 mg by mouth daily.   temazepam (RESTORIL) 30 MG capsule Take 30 mg by mouth at bedtime as needed for sleep.   umeclidinium-vilanterol (ANORO ELLIPTA) 62.5-25 MCG/ACT AEPB Inhale 1 puff into the lungs daily.   Vitamin D, Ergocalciferol, (  DRISDOL) 1.25 MG (50000 UNIT) CAPS capsule TAKE 1 CAPSULE  ONCE   AS DIRECTED   Facility-Administered Encounter Medications as of 03/18/2022  Medication   dexamethasone (DECADRON) injection 4 mg    Allergies as of 03/18/2022 - Review Complete 03/18/2022  Allergen Reaction Noted   Codeine Nausea Only    Crestor  [rosuvastatin] Other (See Comments) 06/27/2015   Dilaudid [hydromorphone] Nausea Only 07/12/2021   Hydromorphone hcl Nausea Only    Trazodone and nefazodone Other (See Comments) 06/27/2015    Past Medical History:  Diagnosis Date   Anxiety    Cancer ([place])    lung   Depression    Diabetes mellitus without complication ([place])    Diabetic peripheral neuropathy ([place])    High  cholesterol    Parkinson's disease ([place])    Thyroid disease     Past Surgical History:  Procedure Laterality Date   FINGER NAIL SURGERY     LEFT HEART CATHETERIZATION WITH CORONARY ANGIOGRAM N/A 01/02/2015   Procedure: LEFT HEART CATHETERIZATION WITH CORONARY ANGIOGRAM;  Surgeon: Adrian Prows, MD;  Location: [facility] CATH LAB;  Service: Cardiovascular;  Laterality: N/A;   LUNG CANCER SURGERY     MOUTH SURGERY  03/22/2018   ROTATOR CUFF REPAIR Right Around 2015    Family History  Problem Relation Age of Onset   Heart failure Mother    Non-Hodgkin's lymphoma Father    Diabetes Sister    Heart disease Sister    Heart disease Sister    Diabetes Sister    Heart disease Sister    Diabetes Sister     Social History   Socioeconomic History   Marital status: Married    Spouse name: Not on file   Number of children: 0   Years of education: Not on file   Highest education level: Not on file  Occupational History   Not on file  Tobacco Use   Smoking status: Former    Packs/day: 1.00    Years: 20.00    Total pack years: 20.00    Types: Cigarettes    Quit date: 06/26/2010    Years since quitting: 11.7   Smokeless tobacco: Never  Vaping Use   Vaping Use: Never used  Substance and Sexual Activity   Alcohol use: No    Alcohol/week: 0.0 standard drinks of alcohol   Drug use: No   Sexual activity: Not on file  Other Topics Concern   Not on file  Social History Narrative   Not on file   Social Determinants of Health   Financial Resource Strain: Not on file  Food Insecurity: Not on file  Transportation Needs: Not on file  Physical Activity: Not on file  Stress: Not on file  Social Connections: Not on file  Intimate Partner Violence: Not on file    Review of Systems  Constitutional:  Positive for fatigue.  Respiratory:  Positive for shortness of breath.   Psychiatric/Behavioral:  Positive for sleep disturbance.     Vitals:   03/18/22 1037  BP: 118/76  Pulse: 87  Temp:  98.6 F (37 C)  SpO2: 97%     Physical Exam Constitutional:      Appearance: Normal appearance.  HENT:     Head: Normocephalic.     Right Ear: Tympanic membrane normal.     Mouth/Throat:     Mouth: Mucous membranes are moist.  Eyes:     Pupils: Pupils are equal, round, and reactive to light.  Cardiovascular:  Rate and Rhythm: Normal rate and regular rhythm.     Heart sounds: No murmur heard. Pulmonary:     Effort: No respiratory distress.     Breath sounds: No stridor. No wheezing or rhonchi.  Musculoskeletal:     Cervical back: No rigidity or tenderness.  Neurological:     Mental Status: She is alert.  Psychiatric:        Mood and Affect: Mood normal.      Data Reviewed: Recent sleep study reviewed read by Dr. Brett Fairy did show mild obstructive sleep apnea, CPAP titration was recommended Patient was started on automated CPAP Not tolerated and has not been using it  Most recent CT scan of the chest reviewed in care everywhere 11/11/2021 showed multiple semisolid nodules, stable from previously-follows up regularly with oncology     Assessment:  History of hypertension History of orthostatic hypotension  History of bronchoalveolar carcinoma  History of obstructive lung disease  Mild obstructive sleep apnea -Intolerant of CPAP -Has managed to lose over 40 pounds recently  Shortness of breath on exertion -Likely multifactorial partly due to deconditioning, partly due to underlying obstructive lung disease  History of insomnia -Tolerating temazepam well  Plan/Recommendations: We will schedule patient for a pulmonary function test  Prescription for Anoro and albuterol sent to pharmacy  Graded exercises as tolerated  Encourage patient to start exercising on a regular basis, regular walks Try and get up to walking possibly 10,000 steps a day  Tentative follow-up in 4 to 6 weeks  Follow-up CT scan as previously ordered, has one ordered for  2024  Encouraged to call with significant concerns   Sherrilyn Rist MD  Pulmonary and Critical Care 03/18/2022, 11:18 AM  CC: Reynold Bowen, MD

## 2022-03-19 MED ORDER — ANORO ELLIPTA 62.5-25 MCG/ACT IN AEPB: 1.0000 | INHALATION_SPRAY | Freq: Every day | RESPIRATORY_TRACT | 0 refills | Status: DC

## 2022-03-19 NOTE — Addendum Note (Signed)
Addended by: Dessie Coma on: 03/19/2022 05:32 PM   Modules accepted: Orders

## 2022-04-07 ENCOUNTER — Other Ambulatory Visit: Payer: Self-pay | Admitting: *Deleted

## 2022-04-07 MED ORDER — UMECLIDINIUM-VILANTEROL 62.5-25 MCG/ACT IN AEPB: 1.0000 | INHALATION_SPRAY | Freq: Every day | RESPIRATORY_TRACT | 3 refills | Status: DC

## 2022-05-21 ENCOUNTER — Ambulatory Visit (INDEPENDENT_AMBULATORY_CARE_PROVIDER_SITE_OTHER): Payer: Medicare Other | Admitting: Pulmonary Disease

## 2022-05-21 ENCOUNTER — Encounter: Payer: Self-pay | Admitting: Pulmonary Disease

## 2022-05-21 VITALS — BP 120/70 | HR 76 | Temp 98.1°F | Ht 64.0 in | Wt 152.0 lb

## 2022-05-21 DIAGNOSIS — R0609 Other forms of dyspnea: Secondary | ICD-10-CM | POA: Diagnosis not present

## 2022-05-21 DIAGNOSIS — R0602 Shortness of breath: Secondary | ICD-10-CM | POA: Diagnosis not present

## 2022-05-21 DIAGNOSIS — C3491 Malignant neoplasm of unspecified part of right bronchus or lung: Secondary | ICD-10-CM | POA: Diagnosis not present

## 2022-05-21 LAB — PULMONARY FUNCTION TEST
DL/VA % pred: 126 %
DL/VA: 5.26 ml/min/mmHg/L
DLCO cor % pred: 103 %
DLCO cor: 20.54 ml/min/mmHg
DLCO unc % pred: 103 %
DLCO unc: 20.54 ml/min/mmHg
FEF 25-75 Post: 1.64 L/sec
FEF 25-75 Pre: 1.46 L/sec
FEF2575-%Change-Post: 12 %
FEF2575-%Pred-Post: 78 %
FEF2575-%Pred-Pre: 70 %
FEV1-%Change-Post: 3 %
FEV1-%Pred-Post: 78 %
FEV1-%Pred-Pre: 75 %
FEV1-Post: 1.88 L
FEV1-Pre: 1.81 L
FEV1FVC-%Change-Post: -1 %
FEV1FVC-%Pred-Pre: 99 %
FEV6-%Change-Post: 4 %
FEV6-%Pred-Post: 82 %
FEV6-%Pred-Pre: 78 %
FEV6-Post: 2.47 L
FEV6-Pre: 2.37 L
FEV6FVC-%Pred-Post: 104 %
FEV6FVC-%Pred-Pre: 104 %
FVC-%Change-Post: 4 %
FVC-%Pred-Post: 79 %
FVC-%Pred-Pre: 75 %
FVC-Post: 2.47 L
FVC-Pre: 2.37 L
Post FEV1/FVC ratio: 76 %
Post FEV6/FVC ratio: 100 %
Pre FEV1/FVC ratio: 77 %
Pre FEV6/FVC Ratio: 100 %
RV % pred: 105 %
RV: 2.22 L
TLC % pred: 94 %
TLC: 4.79 L

## 2022-05-21 NOTE — Patient Instructions (Signed)
Full PFT performed today. °

## 2022-05-21 NOTE — Patient Instructions (Signed)
Continue using Anoro  Rescue inhaler use as needed  Continue to stay very active  Call with significant concerns  I will see you back in about 6 months

## 2022-05-21 NOTE — Progress Notes (Signed)
Full PFT performed today. °

## 2022-05-21 NOTE — Progress Notes (Signed)
Kelli Rojas    [id number]    03/21/1956  Primary Care Physician:South, Annie Main, MD  Referring Physician: Reynold Bowen, [place],  [place] [address]  Chief complaint:   Patient is being seen for shortness of breath on exertion  HPI:  Has no new concerns Continues to stay active Anoro seems to be helping symptoms Rare use of rescue inhaler Has been much more active since starting Anoro  History of lung cancer for which she had wedge resection of right lower lobe in 2011 Bronchoalveolar carcinoma of right lung in 2011 Quit smoking in 2011  Diagnosed with mild obstructive sleep apnea in 2019 Did receive his CPAP but not able to tolerate CPAP  Has chronic insomnia for which she uses temazepam -This continues to help her get good enough sleep  She has managed to lose about 40 pounds this year on Mounjaro  Usually goes to bed about 12 midnight, falls asleep in about 10 minutes About 1 awakening Wake up time about noon  She does have a history of diabetes, hypercholesterolemia  Has not recently been on any inhalers but did use an inhaler in the past, does not recollect whether it helped  She smoked 1 pack a day up until when she quit in 2011  She did office work, 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   aspirin 81 MG tablet Take 81 mg by mouth daily.   atorvastatin (LIPITOR) 40 MG tablet TAKE 1 TABLET DAILY   BD PEN NEEDLE NANO 2ND GEN 32G X 4 MM MISC    Calcium Carbonate-Vit D-Min (CALCIUM 1200 PO) Take 1 tablet by mouth daily.   Chromium Picolinate 1000 MCG TABS Take 1 tablet by mouth daily.   Continuous Blood Gluc Sensor (FREESTYLE LIBRE 2 SENSOR)  MISC USE TO MONITOR BLOOD GLUCOSE CONTINUOUSLY AS DIRECTED. CHANGE EVERY 14 DAYS   DULoxetine (CYMBALTA) 60 MG capsule    gabapentin (NEURONTIN) 300 MG capsule One tab PO qHS for a week, then BID for a week, then TID. May double weekly to a max of 3,600mg /day   HUMALOG KWIKPEN 100 UNIT/ML KwikPen Inject into the skin.   JARDIANCE 25 MG TABS tablet    Lancets (FREESTYLE) lancets SMARTSIG:1 Each Topical 4 Times Daily   LANTUS SOLOSTAR 100 UNIT/ML Solostar Pen 30 Units every morning.   levothyroxine (SYNTHROID) 137 MCG tablet Take 137 mcg by mouth daily before breakfast.   Menaquinone-7 (VITAMIN K2) 100 MCG CAPS Take by mouth.   MOUNJARO 12.5 MG/0.5ML Pen SMARTSIG:12.5 Milligram(s) SUB-Q Once a Week   NITROSTAT 0.4 MG SL tablet Place 0.4 mg under the tongue every 5 (five) minutes x 3 doses as needed.   Omega-3 Fatty Acids (FISH OIL) 1000 MG CAPS Take 1,000 mg by mouth 2 (two) times daily.    omeprazole (PRILOSEC) 40 MG capsule Take 40 mg by mouth daily.   temazepam (RESTORIL) 30 MG capsule Take 30 mg by mouth at bedtime as needed for sleep.   umeclidinium-vilanterol (ANORO ELLIPTA) 62.5-25 MCG/ACT AEPB Inhale 1 puff into the lungs daily.   Vitamin D, Ergocalciferol, (DRISDOL) 1.25 MG (50000 UNIT) CAPS capsule TAKE 1 CAPSULE  ONCE  AS DIRECTED   [DISCONTINUED] umeclidinium-vilanterol (ANORO ELLIPTA) 62.5-25 MCG/ACT AEPB Inhale 1 puff into the lungs daily. (Patient not taking: Reported on 05/21/2022)   Facility-Administered Encounter Medications as of 05/21/2022  Medication   dexamethasone (DECADRON) injection 4 mg    Allergies as of 05/21/2022 - Review Complete 05/21/2022  Allergen Reaction Noted   Codeine Nausea Only    Crestor  [rosuvastatin] Other (See Comments) 06/27/2015   Dilaudid [hydromorphone] Nausea Only 07/12/2021   Hydromorphone hcl Nausea Only    Trazodone and nefazodone Other (See Comments) 06/27/2015    Past Medical History:  Diagnosis Date   Anxiety    Cancer ([place])     lung   Depression    Diabetes mellitus without complication ([place])    Diabetic peripheral neuropathy ([place])    High cholesterol    Parkinson's disease ([place])    Thyroid disease     Past Surgical History:  Procedure Laterality Date   FINGER NAIL SURGERY     LEFT HEART CATHETERIZATION WITH CORONARY ANGIOGRAM N/A 01/02/2015   Procedure: LEFT HEART CATHETERIZATION WITH CORONARY ANGIOGRAM;  Surgeon: Adrian Prows, MD;  Location: [facility] CATH LAB;  Service: Cardiovascular;  Laterality: N/A;   LUNG CANCER SURGERY     MOUTH SURGERY  03/22/2018   ROTATOR CUFF REPAIR Right Around 2015    Family History  Problem Relation Age of Onset   Heart failure Mother    Non-Hodgkin's lymphoma Father    Diabetes Sister    Heart disease Sister    Heart disease Sister    Diabetes Sister    Heart disease Sister    Diabetes Sister     Social History   Socioeconomic History   Marital status: Married    Spouse name: Not on file   Number of children: 0   Years of education: Not on file   Highest education level: Not on file  Occupational History   Not on file  Tobacco Use   Smoking status: Former    Packs/day: 1.00    Years: 20.00    Total pack years: 20.00    Types: Cigarettes    Quit date: 06/26/2010    Years since quitting: 11.9   Smokeless tobacco: Never  Vaping Use   Vaping Use: Never used  Substance and Sexual Activity   Alcohol use: No    Alcohol/week: 0.0 standard drinks of alcohol   Drug use: No   Sexual activity: Not on file  Other Topics Concern   Not on file  Social History Narrative   Not on file   Social Determinants of Health   Financial Resource Strain: Not on file  Food Insecurity: Not on file  Transportation Needs: Not on file  Physical Activity: Not on file  Stress: Not on file  Social Connections: Not on file  Intimate Partner Violence: Not on file    Review of Systems  Constitutional:  Positive for fatigue.  Respiratory:  Positive for shortness of breath.    Psychiatric/Behavioral:  Positive for sleep disturbance.     Vitals:   05/21/22 1614  BP: 120/70  Pulse: 76  Temp: 98.1 F (36.7 C)  SpO2: 96%     Physical Exam Constitutional:      Appearance: Normal appearance.  HENT:     Right Ear: Tympanic membrane normal.     Mouth/Throat:     Mouth: Mucous membranes are moist.  Eyes:     Pupils: Pupils are equal, round, and reactive to light.  Cardiovascular:  Rate and Rhythm: Normal rate and regular rhythm.     Heart sounds: No murmur heard. Pulmonary:     Effort: No respiratory distress.     Breath sounds: No stridor. No wheezing or rhonchi.  Musculoskeletal:     Cervical back: No rigidity or tenderness.  Neurological:     Mental Status: She is alert.  Psychiatric:        Mood and Affect: Mood normal.   PFT reviewed showing no obstruction, no significant bronchodilator response, no restriction, normal diffusing capacity   Data Reviewed: Recent sleep study reviewed read by Dr. Brett Fairy did show mild obstructive sleep apnea, CPAP titration was recommended Patient was started on automated CPAP Not tolerated and has not been using it  Most recent CT scan of the chest reviewed in care everywhere 11/11/2021 showed multiple semisolid nodules, stable from previously-follows up regularly with oncology She does have a follow-up CT scheduled for February 2024  Assessment:  Obstructive lung disease  History of bronchoalveolar carcinoma  History of hypertension  Mild obstructive sleep apnea -Has managed to lose over 40 pounds recently -Did not tolerate CPAP  Shortness of breath on exertion -Likely multifactorial partly due to deconditioning, partly due to underlying obstructive lung disease -Has improved significantly since starting Anoro  History of insomnia -Tolerating temazepam well  Plan/Recommendations: Continue Anoro  Continue graded exercise as tolerated  Encouraged to make sure she follows up with a CT scan  chest in February  Follow-up in 6 months   Sherrilyn Rist MD [place] [facility] 05/21/2022, 4:16 PM  CC: Reynold Bowen, MD

## 2022-06-23 ENCOUNTER — Other Ambulatory Visit (HOSPITAL_COMMUNITY)
Admission: RE | Admit: 2022-06-23 | Discharge: 2022-06-23 | Disposition: A | Discharge status: Home / Self Care | Payer: Medicare Other | Source: Ambulatory Visit | Attending: Obstetrics and Gynecology | Admitting: Obstetrics and Gynecology

## 2022-06-23 ENCOUNTER — Ambulatory Visit (INDEPENDENT_AMBULATORY_CARE_PROVIDER_SITE_OTHER): Payer: Medicare Other | Admitting: Obstetrics and Gynecology

## 2022-06-23 ENCOUNTER — Encounter: Payer: Self-pay | Admitting: Obstetrics and Gynecology

## 2022-06-23 VITALS — BP 105/60 | HR 86 | Ht 64.0 in | Wt 146.0 lb

## 2022-06-23 DIAGNOSIS — K921 Melena: Secondary | ICD-10-CM

## 2022-06-23 DIAGNOSIS — Z1151 Encounter for screening for human papillomavirus (HPV): Secondary | ICD-10-CM | POA: Insufficient documentation

## 2022-06-23 DIAGNOSIS — L989 Disorder of the skin and subcutaneous tissue, unspecified: Secondary | ICD-10-CM | POA: Insufficient documentation

## 2022-06-23 DIAGNOSIS — N9089 Other specified noninflammatory disorders of vulva and perineum: Secondary | ICD-10-CM

## 2022-06-23 DIAGNOSIS — Z124 Encounter for screening for malignant neoplasm of cervix: Secondary | ICD-10-CM

## 2022-06-23 DIAGNOSIS — Z01419 Encounter for gynecological examination (general) (routine) without abnormal findings: Secondary | ICD-10-CM | POA: Insufficient documentation

## 2022-06-23 DIAGNOSIS — M8580 Other specified disorders of bone density and structure, unspecified site: Secondary | ICD-10-CM | POA: Diagnosis not present

## 2022-06-23 DIAGNOSIS — Z780 Asymptomatic menopausal state: Secondary | ICD-10-CM

## 2022-06-23 NOTE — Progress Notes (Signed)
   BIOPSY NOTE  HEAVIN SEBREE [id number]  06/23/2022  Indication for biopsy: new lesion, irregularly hyperpigmented 1 x 1 cm skin lesion on left mons since June noted by patient  The indications for biopsy were reviewed. Risks of the biopsy including pain, bleeding, infection, inadequate specimen, and need for additional procedures were discussed. The patient stated understanding and agreed to undergo procedure today. Consent was signed, and an adequate time out performed.   The patient's mons was prepped with Betadine. 1% lidocaine was injected into the area. A 3-mm punch biopsy was done at left mons, biopsy tissue was picked up with sterile forceps and sterile scissors were used to excise the lesion.  Small bleeding was noted and hemostasis was achieved using silver nitrate sticks.    The patient tolerated the procedure well.   Specimens: mons skin biopsy  Post-procedure instructions were given to the patient. The patient is to call with heavy bleeding, fever greater than 100.4, foul smelling vaginal discharge or other concerns. The patient will be return to clinic in two weeks for discussion of results.   Feliz Beam, M.D. Attending [place], [facility] for [employer], [place]

## 2022-06-23 NOTE — Progress Notes (Signed)
GYNECOLOGY ANNUAL PREVENTATIVE CARE ENCOUNTER NOTE  Subjective:   Kelli Rojas is a 66 y.o. G0P0000 female here for a annual gynecologic exam. Current complaints: here for pap smear. Has not had one in about 10 years because she didn't know she needed one.   Does have vaginal dryness. Last period was mid 22s, has not had a period since.   She felt a lump in left breast this summer and was diagnosed with fatty tissue based on mammogram/ultrasound. No biopsy done.   Also has a purple spot on mons that has been there since June.     Denies abnormal vaginal bleeding, discharge, pelvic pain, problems with intercourse or other gynecologic concerns. Declines STI screen.   Gynecologic History No LMP recorded. Patient is postmenopausal. Contraception: post menopausal status Last Pap: 10 years ago. Results: normal per patient Last mammogram: 04/2022. Results: [place] 2 DEXA: has had  Obstetric History OB History  Gravida Para Term Preterm AB Living  0 0 0 0 0 0  SAB IAB Ectopic Multiple Live Births  0 0 0 0 0    Past Medical History:  Diagnosis Date   Anxiety    Cancer ([place])    lung   Depression    Diabetes mellitus without complication ([place])    Diabetic peripheral neuropathy ([place])    High cholesterol    Parkinson's disease ([place])    Thyroid disease     Past Surgical History:  Procedure Laterality Date   FINGER NAIL SURGERY     LEFT HEART CATHETERIZATION WITH CORONARY ANGIOGRAM N/A 01/02/2015   Procedure: LEFT HEART CATHETERIZATION WITH CORONARY ANGIOGRAM;  Surgeon: Adrian Prows, MD;  Location: [facility] CATH LAB;  Service: Cardiovascular;  Laterality: N/A;   LUNG CANCER SURGERY     MOUTH SURGERY  03/22/2018   ROTATOR CUFF REPAIR Right Around 2015    Current Outpatient Medications on File Prior to Visit  Medication Sig Dispense Refill   albuterol (VENTOLIN HFA) 108 (90 Base) MCG/ACT inhaler Inhale 2 puffs into the lungs every 6 (six) hours as needed for wheezing or shortness of  breath. 8 g 6   aspirin 81 MG tablet Take 81 mg by mouth daily.     atorvastatin (LIPITOR) 40 MG tablet TAKE 1 TABLET DAILY 90 tablet 3   BD PEN NEEDLE NANO 2ND GEN 32G X 4 MM MISC      Calcium Carbonate-Vit D-Min (CALCIUM 1200 PO) Take 1 tablet by mouth daily.     Chromium Picolinate 1000 MCG TABS Take 1 tablet by mouth daily.     Continuous Blood Gluc Sensor (FREESTYLE LIBRE 2 SENSOR) MISC USE TO MONITOR BLOOD GLUCOSE CONTINUOUSLY AS DIRECTED. CHANGE EVERY 14 DAYS     DULoxetine (CYMBALTA) 60 MG capsule      gabapentin (NEURONTIN) 300 MG capsule One tab PO qHS for a week, then BID for a week, then TID. May double weekly to a max of 3,600mg /day 180 capsule 3   HUMALOG KWIKPEN 100 UNIT/ML KwikPen Inject into the skin.     JARDIANCE 25 MG TABS tablet      Lancets (FREESTYLE) lancets SMARTSIG:1 Each Topical 4 Times Daily     LANTUS SOLOSTAR 100 UNIT/ML Solostar Pen 30 Units every morning.     levothyroxine (SYNTHROID) 137 MCG tablet Take 137 mcg by mouth daily before breakfast.     Menaquinone-7 (VITAMIN K2) 100 MCG CAPS Take by mouth.     MOUNJARO 12.5 MG/0.5ML Pen SMARTSIG:12.5 Milligram(s) SUB-Q Once a Week  NITROSTAT 0.4 MG SL tablet Place 0.4 mg under the tongue every 5 (five) minutes x 3 doses as needed.  0   Omega-3 Fatty Acids (FISH OIL) 1000 MG CAPS Take 1,000 mg by mouth 2 (two) times daily.      omeprazole (PRILOSEC) 40 MG capsule Take 40 mg by mouth daily.     temazepam (RESTORIL) 30 MG capsule Take 30 mg by mouth at bedtime as needed for sleep.     umeclidinium-vilanterol (ANORO ELLIPTA) 62.5-25 MCG/ACT AEPB Inhale 1 puff into the lungs daily. 180 each 3   Vitamin D, Ergocalciferol, (DRISDOL) 1.25 MG (50000 UNIT) CAPS capsule TAKE 1 CAPSULE  ONCE   AS DIRECTED     Current Facility-Administered Medications on File Prior to Visit  Medication Dose Route Frequency Provider Last Rate Last Admin   dexamethasone (DECADRON) injection 4 mg  4 mg Intra-articular Once Lorenda Peck, DPM         Allergies  Allergen Reactions   Codeine Nausea Only   Crestor  [Rosuvastatin] Other (See Comments)    myalgia   Dilaudid [Hydromorphone] Nausea Only   Hydromorphone Hcl Nausea Only   Trazodone And Nefazodone Other (See Comments)    HA, sleepy hangover    Social History   Socioeconomic History   Marital status: Married    Spouse name: Not on file   Number of children: 0   Years of education: Not on file   Highest education level: Not on file  Occupational History   Not on file  Tobacco Use   Smoking status: Former    Packs/day: 1.00    Years: 20.00    Total pack years: 20.00    Types: Cigarettes    Quit date: 06/26/2010    Years since quitting: 12.0   Smokeless tobacco: Never  Vaping Use   Vaping Use: Never used  Substance and Sexual Activity   Alcohol use: No    Alcohol/week: 0.0 standard drinks of alcohol   Drug use: No   Sexual activity: Not on file  Other Topics Concern   Not on file  Social History Narrative   Not on file   Social Determinants of Health   Financial Resource Strain: Not on file  Food Insecurity: Not on file  Transportation Needs: Not on file  Physical Activity: Not on file  Stress: Not on file  Social Connections: Not on file  Intimate Partner Violence: Not on file    Family History  Problem Relation Age of Onset   Heart failure Mother    Non-Hodgkin's lymphoma Father    Diabetes Sister    Heart disease Sister    Heart disease Sister    Diabetes Sister    Heart disease Sister    Diabetes Sister     The following portions of the patient's history were reviewed and updated as appropriate: allergies, current medications, past family history, past medical history, past social history, past surgical history and problem list.  Review of Systems Pertinent items are noted in HPI.   Objective:  BP 105/60   Pulse 86   Ht 5\' 4"  (1.626 m)   Wt 146 lb (66.2 kg)   BMI 25.06 kg/m  CONSTITUTIONAL: Well-developed, well-nourished  female in no acute distress.  HENT:  Normocephalic, atraumatic, External right and left ear normal. Oropharynx is clear and moist EYES: Conjunctivae and EOM are normal. Pupils are equal, round, and reactive to light. No scleral icterus.  NECK: Normal range of motion, supple, no masses.  Normal thyroid.  SKIN: Skin is warm and dry. No rash noted. Not diaphoretic. No erythema. No pallor. NEUROLOGIC: Alert and oriented to person, place, and time. Normal reflexes, muscle tone coordination. No cranial nerve deficit noted. PSYCHIATRIC: Normal mood and affect. Normal behavior. Normal judgment and thought content. CARDIOVASCULAR: Normal heart rate noted RESPIRATORY: Effort normal, no problems with respiration noted. BREASTS: Symmetric in size. No masses, skin changes, nipple drainage, or lymphadenopathy. ABDOMEN: Soft, no distention noted.  No tenderness, rebound or guarding.  PELVIC: Hyperpigmented 1 x 1 cm spot on mons, non-uniform in color or border, flat lesion. See note for biopsy. Normal appearing external genitalia; atrophic appearing vaginal mucosa and cervix.  No abnormal discharge noted.  Pap smear obtained. Normal uterine size, no other palpable masses, no uterine or adnexal tenderness. MUSCULOSKELETAL: Normal range of motion. No tenderness.  No cyanosis, clubbing, or edema.   Exam done with chaperone present.    Assessment and Plan:   1. Osteopenia after menopause - DG BONE DENSITY (DXA); Future  2. Well woman exam Healthy female exam - Cytology - PAP( [facility])  3. Cervical cancer screening  4. Skin lesion See note for biopsy - Surgical pathology( / [facility])  5. Bloody stool - Ambulatory referral to Gastroenterology   Will follow up results of pap smear and manage accordingly. Encouraged improvement in diet and exercise.  Declines STI screen. Mammogram UTD Referral for colonoscopy today Flu vaccine today DEXA ordered  Routine preventative health  maintenance measures emphasized. Please refer to After Visit Summary for other counseling recommendations.    Feliz Beam, MD, [place] for [employer] [facility])

## 2022-06-25 ENCOUNTER — Ambulatory Visit (INDEPENDENT_AMBULATORY_CARE_PROVIDER_SITE_OTHER): Payer: Medicare Other

## 2022-06-25 DIAGNOSIS — Z780 Asymptomatic menopausal state: Secondary | ICD-10-CM | POA: Diagnosis not present

## 2022-06-26 LAB — CYTOLOGY - PAP
Comment: NEGATIVE
Diagnosis: NEGATIVE
High risk HPV: NEGATIVE

## 2022-07-02 LAB — SURGICAL PATHOLOGY

## 2022-07-14 ENCOUNTER — Ambulatory Visit (INDEPENDENT_AMBULATORY_CARE_PROVIDER_SITE_OTHER): Payer: Medicare Other | Admitting: Obstetrics and Gynecology

## 2022-07-14 ENCOUNTER — Encounter: Payer: Self-pay | Admitting: Obstetrics and Gynecology

## 2022-07-14 VITALS — BP 114/72 | HR 87 | Ht 64.0 in | Wt 143.0 lb

## 2022-07-14 DIAGNOSIS — L439 Lichen planus, unspecified: Secondary | ICD-10-CM

## 2022-07-14 MED ORDER — CLOBETASOL PROPIONATE 0.05 % EX OINT: TOPICAL_OINTMENT | CUTANEOUS | 1 refills | Status: DC

## 2022-07-14 NOTE — Patient Instructions (Signed)
You have been diagnosed with Lichen Planus. Please see your dentist for thorough oral evaluation.

## 2022-07-14 NOTE — Progress Notes (Signed)
GYNECOLOGY OFFICE FOLLOW UP NOTE  History:  66 y.o. G0P0000 here today for follow up for new diagnosis of lichen planus on biopsy of mons. She is doing well, biopsy is healing. No new complaints.    Past Medical History:  Diagnosis Date   Anxiety    Cancer ([place])    lung   Depression    Diabetes mellitus without complication ([place])    Diabetic peripheral neuropathy ([place])    High cholesterol    Parkinson's disease    Thyroid disease     Past Surgical History:  Procedure Laterality Date   FINGER NAIL SURGERY     LEFT HEART CATHETERIZATION WITH CORONARY ANGIOGRAM N/A 01/02/2015   Procedure: LEFT HEART CATHETERIZATION WITH CORONARY ANGIOGRAM;  Surgeon: Adrian Prows, MD;  Location: [facility] CATH LAB;  Service: Cardiovascular;  Laterality: N/A;   LUNG CANCER SURGERY     MOUTH SURGERY  03/22/2018   ROTATOR CUFF REPAIR Right Around 2015     Current Outpatient Medications:    albuterol (VENTOLIN HFA) 108 (90 Base) MCG/ACT inhaler, Inhale 2 puffs into the lungs every 6 (six) hours as needed for wheezing or shortness of breath., Disp: 8 g, Rfl: 6   aspirin 81 MG tablet, Take 81 mg by mouth daily., Disp: , Rfl:    atorvastatin (LIPITOR) 40 MG tablet, TAKE 1 TABLET DAILY, Disp: 90 tablet, Rfl: 3   BD PEN NEEDLE NANO 2ND GEN 32G X 4 MM MISC, , Disp: , Rfl:    Calcium Carbonate-Vit D-Min (CALCIUM 1200 PO), Take 1 tablet by mouth daily., Disp: , Rfl:    Chromium Picolinate 1000 MCG TABS, Take 1 tablet by mouth daily., Disp: , Rfl:    Continuous Blood Gluc Sensor (FREESTYLE LIBRE 2 SENSOR) MISC, USE TO MONITOR BLOOD GLUCOSE CONTINUOUSLY AS DIRECTED. CHANGE EVERY 14 DAYS, Disp: , Rfl:    DULoxetine (CYMBALTA) 60 MG capsule, , Disp: , Rfl:    gabapentin (NEURONTIN) 300 MG capsule, One tab PO qHS for a week, then BID for a week, then TID. May double weekly to a max of 3,600mg /day, Disp: 180 capsule, Rfl: 3   HUMALOG KWIKPEN 100 UNIT/ML KwikPen, Inject into the skin., Disp: , Rfl:    JARDIANCE 25 MG TABS  tablet, , Disp: , Rfl:    Lancets (FREESTYLE) lancets, SMARTSIG:1 Each Topical 4 Times Daily, Disp: , Rfl:    LANTUS SOLOSTAR 100 UNIT/ML Solostar Pen, 30 Units every morning., Disp: , Rfl:    levothyroxine (SYNTHROID) 137 MCG tablet, Take 137 mcg by mouth daily before breakfast., Disp: , Rfl:    Menaquinone-7 (VITAMIN K2) 100 MCG CAPS, Take by mouth., Disp: , Rfl:    MOUNJARO 12.5 MG/0.5ML Pen, SMARTSIG:12.5 Milligram(s) SUB-Q Once a Week, Disp: , Rfl:    NITROSTAT 0.4 MG SL tablet, Place 0.4 mg under the tongue every 5 (five) minutes x 3 doses as needed., Disp: , Rfl: 0   Omega-3 Fatty Acids (FISH OIL) 1000 MG CAPS, Take 1,000 mg by mouth 2 (two) times daily. , Disp: , Rfl:    omeprazole (PRILOSEC) 40 MG capsule, Take 40 mg by mouth daily., Disp: , Rfl:    temazepam (RESTORIL) 30 MG capsule, Take 30 mg by mouth at bedtime as needed for sleep., Disp: , Rfl:    umeclidinium-vilanterol (ANORO ELLIPTA) 62.5-25 MCG/ACT AEPB, Inhale 1 puff into the lungs daily., Disp: 180 each, Rfl: 3   Vitamin D, Ergocalciferol, (DRISDOL) 1.25 MG (50000 UNIT) CAPS capsule, TAKE 1 CAPSULE  ONCE  AS DIRECTED, Disp: , Rfl:   Current Facility-Administered Medications:    dexamethasone (DECADRON) injection 4 mg, 4 mg, Intra-articular, Once, Lorenda Peck, DPM  The following portions of the patient's history were reviewed and updated as appropriate: allergies, current medications, past family history, past medical history, past social history, past surgical history and problem list.   Review of Systems:  Pertinent items noted in HPI and remainder of comprehensive ROS otherwise negative.   Objective:  Physical Exam BP 114/72   Pulse 87   Ht 5\' 4"  (1.626 m)   Wt 143 lb (64.9 kg)   BMI 24.55 kg/m  CONSTITUTIONAL: Well-developed, well-nourished female in no acute distress.  HENT:  Normocephalic, atraumatic. External right and left ear normal. Oropharynx is clear and moist EYES: Conjunctivae and EOM are normal.  Pupils are equal, round, and reactive to light. No scleral icterus.  NECK: Normal range of motion, supple, no masses SKIN: Skin is warm and dry. No rash noted. Not diaphoretic. No erythema. No pallor. NEUROLOGIC: Alert and oriented to person, place, and time. Normal reflexes, muscle tone coordination. No cranial nerve deficit noted. PSYCHIATRIC: Normal mood and affect. Normal behavior. Normal judgment and thought content. CARDIOVASCULAR: Normal heart rate noted RESPIRATORY: Effort normal, no problems with respiration noted ABDOMEN: Soft, no distention noted.  Mons biopsy site healing well PELVIC: deferred MUSCULOSKELETAL: Normal range of motion. No edema noted.  Exam done with chaperone present.  Labs and Imaging   Assessment & Plan:   1. Lichen planus - Reviewed etiology, diagnosis and management of lichen planus in detail - rec high steroid treatment x 2 weeks - needs eval with dentist (she is seeing them tomorrow) given high correlation of oral lesions - reviewed need for lifelong management/follow up, she verbalizes understanding Rx sent to pharmacy   Routine preventative health maintenance measures emphasized. Please refer to After Visit Summary for other counseling recommendations.   Return in about 3 months (around 10/14/2022).   Feliz Beam, MD, [place] for [facility])

## 2022-10-13 ENCOUNTER — Ambulatory Visit (INDEPENDENT_AMBULATORY_CARE_PROVIDER_SITE_OTHER): Payer: Medicare Other | Admitting: Obstetrics & Gynecology

## 2022-10-13 ENCOUNTER — Encounter: Payer: Self-pay | Admitting: Obstetrics & Gynecology

## 2022-10-13 DIAGNOSIS — Z780 Asymptomatic menopausal state: Secondary | ICD-10-CM | POA: Diagnosis not present

## 2022-10-13 DIAGNOSIS — L439 Lichen planus, unspecified: Secondary | ICD-10-CM | POA: Diagnosis not present

## 2022-10-13 DIAGNOSIS — M8580 Other specified disorders of bone density and structure, unspecified site: Secondary | ICD-10-CM | POA: Diagnosis not present

## 2022-10-13 MED ORDER — CLOBETASOL PROPIONATE 0.05 % EX OINT: TOPICAL_OINTMENT | CUTANEOUS | 1 refills | Status: AC

## 2022-10-13 NOTE — Progress Notes (Signed)
GYN VISIT Patient name: Kelli Rojas MRN [id number]  Date of birth: 09-11-1956 Chief Complaint:   Follow-up ([place])  History of Present Illness:   Kelli Rojas is a 67 y.o. G0P0000 PM female being seen today for lichen planus follow up.  Today she notes that she has had no acute changes.  Denies itching or irritation.  Denies pelvic pain.  She has not required any further treatment.  Pt seen by dentist and informed of diagnosis.     No LMP recorded. Patient is postmenopausal.     09/30/2018   10:53 AM  Depression screen PHQ 2/9  Decreased Interest 0  Down, Depressed, Hopeless 0  PHQ - 2 Score 0     Review of Systems:   Pertinent items are noted in HPI Denies fever/chills, dizziness, headaches, visual disturbances, fatigue, shortness of breath, chest pain, abdominal pain, vomiting, no problems with bowel movements, urination, or intercourse unless otherwise stated above.  Pertinent History Reviewed:  Reviewed past medical,surgical, social, obstetrical and family history.  Reviewed problem list, medications and allergies. Physical Assessment:   Vitals:   10/13/22 1312  BP: 103/65  Pulse: 75  Resp: 16  Weight: 136 lb (61.7 kg)  Height: 5\' 4"  (1.626 m)  Body mass index is 23.34 kg/m.       Physical Examination:   General appearance: alert, well appearing, and in no distress  Psych: mood appropriate, normal affect  Skin: warm & dry   Cardiovascular: normal heart rate noted  Respiratory: normal respiratory effort, no distress  Abdomen: soft, non-tender   Pelvic: On left side of upper portion of mons- ~ 1cm irregular hyperpigmented area noted- no other acute abnormalities seen.   GU examination not indicated  Extremities: no edema, no calf tenderness bilaterally  Chaperone: N/A    Assessment & Plan:  1) Lichen planus -remains asymptomatic -clobetasol sent in to use if needed -follow up yearly  2) Osteopenia -reviewed concerns questions regarding diagnosis and  treatment -discussed pros/cons of Fosamax   Return in about 1 year (around 10/14/2023), or if symptoms worsen or fail to improve.   10/16/2023, DO Attending Obstetrician & Gynecologist, [facility] for [facility]

## 2022-11-12 ENCOUNTER — Telehealth: Payer: Self-pay | Admitting: Pulmonary Disease

## 2022-11-25 ENCOUNTER — Other Ambulatory Visit: Payer: Self-pay

## 2022-11-25 ENCOUNTER — Other Ambulatory Visit (HOSPITAL_COMMUNITY): Payer: Self-pay

## 2022-11-25 MED ORDER — MOUNJARO 12.5 MG/0.5ML ~~LOC~~ SOAJ
12.5000 mg | SUBCUTANEOUS | 11 refills | Status: AC
  Filled 2022-11-25: qty 2, 28d supply, fill #0

## 2022-11-26 ENCOUNTER — Ambulatory Visit: Payer: Medicare Other | Admitting: Obstetrics and Gynecology

## 2022-12-10 NOTE — Telephone Encounter (Signed)
khg

## 2023-01-12 ENCOUNTER — Encounter: Payer: Self-pay | Admitting: *Deleted

## 2023-01-29 ENCOUNTER — Ambulatory Visit: Payer: Medicare Other | Admitting: Cardiology

## 2023-01-29 ENCOUNTER — Other Ambulatory Visit: Payer: Self-pay | Admitting: Cardiology

## 2023-02-04 ENCOUNTER — Encounter: Payer: Self-pay | Admitting: Cardiology

## 2023-02-04 ENCOUNTER — Ambulatory Visit: Payer: Medicare Other | Admitting: Cardiology

## 2023-02-04 VITALS — BP 110/67 | HR 80 | Ht 64.0 in | Wt 132.4 lb

## 2023-02-04 DIAGNOSIS — I10 Essential (primary) hypertension: Secondary | ICD-10-CM

## 2023-02-04 DIAGNOSIS — I2510 Atherosclerotic heart disease of native coronary artery without angina pectoris: Secondary | ICD-10-CM

## 2023-02-04 DIAGNOSIS — R072 Precordial pain: Secondary | ICD-10-CM

## 2023-02-04 NOTE — Progress Notes (Signed)
Subjective:   Kelli Rojas, female    DOB: [birth year], 67 y.o.   MRN: [id number]    Chief complaint:  Coronary artery disease  HPI  67 y/o Caucasian female with mild nonobstructive coronary artery disease, type II diabetes mellitus, hyperlipidemia, strong family history of premature CAD, former smoker, lung cancer status post right lobectomy.  Recently, she has had episodes of chest tightness that wake her up from sleep around 2-3 AM, resolved on its own 10 minutes.  She has known GERD, takes omeprazole regularly.  She has not noticed any exertional chest tightness symptoms.   Current Outpatient Medications:    albuterol (VENTOLIN HFA) 108 (90 Base) MCG/ACT inhaler, Inhale 2 puffs into the lungs every 6 (six) hours as needed for wheezing or shortness of breath., Disp: 8 g, Rfl: 6   aspirin 81 MG tablet, Take 81 mg by mouth daily., Disp: , Rfl:    atorvastatin (LIPITOR) 40 MG tablet, TAKE 1 TABLET DAILY, Disp: 90 tablet, Rfl: 3   BD PEN NEEDLE NANO 2ND GEN 32G X 4 MM MISC, , Disp: , Rfl:    Calcium Carbonate-Vit D-Min (CALCIUM 1200 PO), Take 1 tablet by mouth daily., Disp: , Rfl:    Chromium Picolinate 1000 MCG TABS, Take 1 tablet by mouth daily., Disp: , Rfl:    clobetasol ointment (TEMOVATE) 0.05 %, Apply rice sized amount to affected area as needed, Disp: 30 g, Rfl: 1   Continuous Blood Gluc Sensor (FREESTYLE LIBRE 2 SENSOR) MISC, USE TO MONITOR BLOOD GLUCOSE CONTINUOUSLY AS DIRECTED. CHANGE EVERY 14 DAYS, Disp: , Rfl:    DULoxetine (CYMBALTA) 60 MG capsule, , Disp: , Rfl:    gabapentin (NEURONTIN) 300 MG capsule, One tab PO qHS for a week, then BID for a week, then TID. May double weekly to a max of 3,600mg /day, Disp: 180 capsule, Rfl: 3   JARDIANCE 25 MG TABS tablet, , Disp: , Rfl:    Lancets (FREESTYLE) lancets, SMARTSIG:1 Each Topical 4 Times Daily, Disp: , Rfl:    LANTUS SOLOSTAR 100 UNIT/ML Solostar Pen, 30 Units every morning., Disp: , Rfl:    levothyroxine (SYNTHROID)  137 MCG tablet, Take 137 mcg by mouth daily before breakfast., Disp: , Rfl:    MOUNJARO 12.5 MG/0.5ML Pen, SMARTSIG:12.5 Milligram(s) SUB-Q Once a Week, Disp: , Rfl:    NITROSTAT 0.4 MG SL tablet, Place 0.4 mg under the tongue every 5 (five) minutes x 3 doses as needed., Disp: , Rfl: 0   Omega-3 Fatty Acids (FISH OIL) 1000 MG CAPS, Take 1,000 mg by mouth 2 (two) times daily. , Disp: , Rfl:    omeprazole (PRILOSEC) 40 MG capsule, Take 40 mg by mouth daily., Disp: , Rfl:    temazepam (RESTORIL) 30 MG capsule, Take 30 mg by mouth at bedtime as needed for sleep., Disp: , Rfl:    tirzepatide (MOUNJARO) 12.5 MG/0.5ML Pen, Inject 12.5 mg into the skin once a week., Disp: 2 mL, Rfl: 11   umeclidinium-vilanterol (ANORO ELLIPTA) 62.5-25 MCG/ACT AEPB, Inhale 1 puff into the lungs daily., Disp: 180 each, Rfl: 3   Vitamin D, Ergocalciferol, (DRISDOL) 1.25 MG (50000 UNIT) CAPS capsule, TAKE 1 CAPSULE  ONCE   AS DIRECTED, Disp: , Rfl:   Current Facility-Administered Medications:    dexamethasone (DECADRON) injection 4 mg, 4 mg, Intra-articular, Once, Louann Sjogren, DPM  Cardiovascular studies:  EKG 02/04/2023: Sinus rhythm 84 bpm  Normal EKG  Echocardiogram 10/10/2021:  Normal LV systolic function with visual EF 60-65%. Left  ventricle cavity  is normal in size. Normal left ventricular wall thickness. Normal global  wall motion. Normal diastolic filling pattern, normal LAP.  Trace tricuspid regurgitation. No evidence of pulmonary hypertension.  No significant valvular heart disease.  Compared to study 12/21/2014 LVEF remains preserved, G1DD and Mild MR now  improved.   EKG 10/02/2021: Sinus rhythm 77 bpm Low voltage in precordial leads  Old anteroseptal infarct Nonspecific T-abnormality  Coronary angiography 01/02/2015: LM: Normal LAD: ostial 10%, mid 10%, septal perforator 70% ostial stenoses LCx: Normal RCA: Normal. Dominant  Recent labs: 01/23/2023: Glucose 107, BUN/Cr 9/0.63. EGFR 97. Na/K  143/4.1. Rest of the CMP normal H/H 14/43. MCV 90. Platelets 205 Trop HS 6.7, d-Dimer 0.29 normal  12/10/2020: Glucose N/A, BUN/Cr 12/0.7. EGFR 62.  HbA1C 8.3% Chol 133, TG 77, HDL 48, LDL 70 TSH 1.4 normal  04/08/2019: Glucose 123, BUN/Cr 13/0.8. EGFR 72 HbA1C 7.0% Chol 130, TG 86, HDL 37, LDL 76 TSH 0.08 ?low    Review of Systems  Cardiovascular:  Negative for chest pain, dyspnea on exertion, leg swelling, palpitations and syncope.  Neurological:  Positive for light-headedness (Improved). Negative for dizziness.        Vitals:   02/04/23 1258  BP: 110/67  Pulse: 80  SpO2: 98%    Objective:   Physical Exam Vitals and nursing note reviewed.  Constitutional:      Appearance: She is well-developed.  Neck:     Vascular: No JVD.  Cardiovascular:     Rate and Rhythm: Normal rate and regular rhythm.     Pulses: Intact distal pulses.     Heart sounds: Normal heart sounds. No murmur heard. Pulmonary:     Effort: Pulmonary effort is normal.     Breath sounds: Normal breath sounds. No wheezing or rales.  Musculoskeletal:     Right lower leg: No edema.     Left lower leg: No edema.        ICD-10-CM   1. Precordial pain  R07.2 EKG 12-Lead    PCV MYOCARDIAL PERFUSION WO LEXISCAN    2. Coronary artery disease involving native coronary artery of native heart without angina pectoris  I25.10     3. Essential hypertension  I10           Assessment & Recommendations:   67 y/o Caucasian female with mild nonobstructive coronary artery disease, type II diabetes mellitus, hyperlipidemia, strong family history of premature CAD, former smoker, lung cancer status post right lobectomy.  Chest tightness: At rest, not with exertion.  Previously known to have nonobstructive CAD on cath in 2016. Recommend exercise nuclear stress test. With her known GERD, gastric etiology is also possible.  Could see if symptoms improve after holding aspirin for next few  days.  Hypertension: Well controlled.  Hyperlipidemia: Controlled  Type 2 DM: Continue follow-up with Dr. Evlyn Kanner.  F/u in 6 months or sooner, based on stress test results    [name], MD Pager: [phone] Office: [phone]

## 2023-02-12 ENCOUNTER — Ambulatory Visit: Payer: Medicare Other

## 2023-02-12 DIAGNOSIS — R072 Precordial pain: Secondary | ICD-10-CM

## 2023-02-15 LAB — PCV MYOCARDIAL PERFUSION WO LEXISCAN
Angina Index: 0
ST Depression (mm): 0 mm

## 2023-02-24 ENCOUNTER — Other Ambulatory Visit: Payer: Medicare Other

## 2023-03-11 ENCOUNTER — Ambulatory Visit (INDEPENDENT_AMBULATORY_CARE_PROVIDER_SITE_OTHER): Payer: Medicare Other | Admitting: Podiatry

## 2023-03-11 ENCOUNTER — Encounter: Payer: Self-pay | Admitting: Podiatry

## 2023-03-11 DIAGNOSIS — G5761 Lesion of plantar nerve, right lower limb: Secondary | ICD-10-CM | POA: Diagnosis not present

## 2023-03-11 DIAGNOSIS — G5762 Lesion of plantar nerve, left lower limb: Secondary | ICD-10-CM

## 2023-03-11 MED ORDER — METHOCARBAMOL 750 MG PO TABS: 750.0000 mg | ORAL_TABLET | Freq: Four times a day (QID) | ORAL | 0 refills | Status: AC

## 2023-03-11 MED ORDER — DEXAMETHASONE SODIUM PHOSPHATE 120 MG/30ML IJ SOLN
8.0000 mg | Freq: Once | INTRAMUSCULAR | Status: AC
Start: 2023-03-11 — End: 2023-03-11
  Administered 2023-03-11: 8 mg via INTRA_ARTICULAR

## 2023-03-11 NOTE — Addendum Note (Signed)
Addended by: Louann Sjogren R on: 03/11/2023 10:44 AM   Modules accepted: Orders

## 2023-03-11 NOTE — Progress Notes (Signed)
  Subjective:  Patient ID: Kelli Rojas, female    DOB: 1955-10-27,   MRN: [id number]  Chief Complaint  Patient presents with   Peripheral Neuropathy    Patient states her neuropathy is associated with her diabetes. Patient states she feels numbness in her toes. Patient states right foot is worse than left     67 y.o. female presents today for follow-up of neuropathy and neuroma in her toe. Relates new pain on the left side as well that has been worsening and causing numbness in her toes.  Relates injection helped last time and worked for a few months but relates the pain has returned and hoping for another injection.  Relates burning and tingling in the feet.   Last A1c was  8 on 08/29/21. Marland Kitchen Denies any other pedal complaints. Denies n/v/f/c.   PCP Adrian Prince MD.   Past Medical History:  Diagnosis Date   Anxiety    Cancer (HCC)    lung   Depression    Diabetes mellitus without complication (HCC)    Diabetic peripheral neuropathy (HCC)    High cholesterol    Parkinson's disease    Thyroid disease     Objective:  Physical Exam: Vascular: DP/PT pulses 2/4 bilateral. CFT <3 seconds. Normal hair growth on digits. No edema.  Skin. No lacerations or abrasions bilateral feet. Mild incurvation noted to bilateral borders of right hallux.  Musculoskeletal: MMT 5/5 bilateral lower extremities in DF, PF, Inversion and Eversion. Deceased ROM in DF of ankle joint. Pain  in third interspace and pain to palpation. Pain with metatarsal squeeze. On the left today there is pain in the second interspace to palpation and pain with metatarsal squeeze.  Neurological: Decreased sensation dorsally to second third and fifth right digits. Protective sensation intact.  Assessment:   1. Morton's metatarsalgia, neuralgia, or neuroma, left   2. Morton's metatarsalgia, neuralgia, or neuroma, right         Plan:   Patient was evaluated and treated and all questions answered. -Discussed and educated  patient on diabetic foot care, especially with  regards to the vascular, neurological and musculoskeletal systems.  -Stressed the importance of good glycemic control and the detriment of not  controlling glucose levels in relation to the foot. -Discussed supportive shoes at all times and checking feet regularly.  Discussed metatarsalgia and mortons neuroma and treatment options with patient. Likely this is presenting on the left side as well now.  Discussed padding and offloading today. Discussed custom orthotics. Patient would like to hold off.   Injection recommended today. Procedure note below.  Patient to return as needed.      Procedure: Injection Tendon/Ligament Discussed alternatives, risks, complications and verbal consent was obtained.  Location: Right third interspace. Left second interspace  Skin Prep: Alcohol. Injectate: 1cc 0.5% marcaine plain, 1 cc dexamethasone x2 Disposition: Patient tolerated procedure well. Injection site dressed with a band-aid.  Post-injection care was discussed and return precautions discussed.     Louann Sjogren, DPM

## 2023-03-13 ENCOUNTER — Ambulatory Visit
Admission: EM | Admit: 2023-03-13 | Discharge: 2023-03-13 | Disposition: A | Discharge status: Home / Self Care | Payer: Medicare Other | Attending: Family Medicine | Admitting: Family Medicine

## 2023-03-13 DIAGNOSIS — H109 Unspecified conjunctivitis: Secondary | ICD-10-CM | POA: Diagnosis not present

## 2023-03-13 MED ORDER — MOXIFLOXACIN HCL 0.5 % OP SOLN: 1.0000 [drp] | Freq: Three times a day (TID) | OPHTHALMIC | 0 refills | Status: AC

## 2023-03-13 NOTE — Discharge Instructions (Addendum)
Instructed patient to instill eyedrops as directed.  Encouraged increase daily water intake while taking these eyedrops.  Advised patient if right eye develops similar symptoms may use Vigamox and right eye as well.  Advised if symptoms worsen and/or unresolved please follow-up with optometry/ophthalmology for further evaluation.

## 2023-03-13 NOTE — ED Triage Notes (Addendum)
Pt c/o LT eye redness and irritation since waking up this am. Was doing yardwork yesterday spreading out mulch. Tried dry eye drops with no relief. Tried to call opthalmology and no availability.

## 2023-03-13 NOTE — ED Provider Notes (Signed)
[facility]    CSN: [id number] Arrival date & time: 03/13/23  1301      History   Chief Complaint Chief Complaint  Patient presents with   Eye Problem    LT    HPI Kelli Rojas is a 67 y.o. female.   HPI 67 year old female presents with left eye redness and irritation since waking up this morning.  Patient reports was doing yard work yesterday and spreading out mulch.Marland Kitchen  PMH significant for T2DM without complication, Parkinson's disease, and lung cancer.  Past Medical History:  Diagnosis Date   Anxiety    Cancer (HCC)    lung   Depression    Diabetes mellitus without complication (HCC)    Diabetic peripheral neuropathy (HCC)    High cholesterol    Parkinson's disease    Thyroid disease     Patient Active Problem List   Diagnosis Date Noted   Precordial pain 02/04/2023   Orthostatic hypotension 10/31/2021   Dyspnea on exertion 10/02/2021   Lightheadedness 10/02/2021   Bipolar affective disorder (HCC) 07/12/2021   ETD (eustachian tube dysfunction) 07/12/2021   Hypothyroidism 07/12/2021   Lung cancer (HCC) 07/12/2021   Ulcer 07/12/2021   Finger pain, right 05/29/2020   Metatarsalgia, left foot 05/29/2020   Mixed hyperlipidemia 01/06/2020   Essential hypertension 01/06/2020   BPPV (benign paroxysmal positional vertigo), unspecified laterality 09/06/2019   Oral soft tissue complaint 09/06/2019   Coronary artery disease involving native coronary artery of native heart without angina pectoris 01/05/2019   Esophageal dysphagia 11/23/2018   Xerostomia 10/13/2018   Type 2 diabetes mellitus without complication, with long-term current use of insulin (HCC) 09/30/2018   Uncontrolled REM sleep behavior disorder 08/04/2018   Insomnia due to mental disorder 08/04/2018   Closed nondisplaced fracture of proximal phalanx of lesser toe of left foot 07/30/2018   Metatarsalgia of left foot 07/30/2018   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    Posterior subcapsular age-related cataract of right eye 03/14/2016   Encounter for screening colonoscopy 07/26/2015   Diabetes 1.5, 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   Parkinson disease 01/17/2015   Chest pain with high risk for cardiac etiology 01/01/2015   HYPERCHOLESTEROLEMIA 03/09/2009   BRONCHITIS, RECURRENT 03/09/2009   EMPHYSEMA 03/09/2009   SLEEP APNEA 03/09/2009   COUGH 03/09/2009    Past Surgical History:  Procedure Laterality Date   FINGER NAIL SURGERY     LEFT HEART CATHETERIZATION WITH CORONARY ANGIOGRAM N/A 01/02/2015   Procedure: LEFT HEART CATHETERIZATION WITH CORONARY ANGIOGRAM;  Surgeon: Yates Decamp, MD;  Location: [facility] CATH LAB;  Service: Cardiovascular;  Laterality: N/A;   LUNG CANCER SURGERY     MOUTH SURGERY  03/22/2018   ROTATOR CUFF REPAIR Right Around 2015    OB History     Gravida  0   Para  0   Term  0   Preterm  0   AB  0   Living  0      SAB  0   IAB  0   Ectopic  0   Multiple  0   Live Births  0            Home Medications    Prior to Admission medications   Medication Sig Start Date End Date Taking? Authorizing Provider  moxifloxacin (VIGAMOX) 0.5 % ophthalmic solution Place 1 drop into the left eye 3 (three) times daily. 03/13/23  Yes Trevor Iha, FNP  albuterol (VENTOLIN HFA) 108 (90 Base) MCG/ACT  inhaler Inhale 2 puffs into the lungs every 6 (six) hours as needed for wheezing or shortness of breath. 03/18/22   Virl Diamond A, MD  aspirin 81 MG tablet Take 81 mg by mouth daily.    [provider]  atorvastatin (LIPITOR) 40 MG tablet TAKE 1 TABLET DAILY 01/29/23   Patwardhan, Anabel Bene, MD  BD PEN NEEDLE NANO 2ND GEN 32G X 4 MM MISC  06/25/21   [provider]  Calcium Carbonate-Vit D-Min (CALCIUM 1200 PO) Take 1 tablet by mouth daily.    [provider]  Chromium Picolinate 1000 MCG TABS Take 1 tablet by mouth daily.    [provider]  clobetasol ointment (TEMOVATE) 0.05 % Apply rice  sized amount to affected area as needed 10/13/22   Myna Hidalgo, DO  Continuous Blood Gluc Sensor (FREESTYLE LIBRE 2 SENSOR) MISC USE TO MONITOR BLOOD GLUCOSE CONTINUOUSLY AS DIRECTED. CHANGE EVERY 14 DAYS 05/02/21   [provider]  DULoxetine (CYMBALTA) 60 MG capsule  04/02/21   [provider]  gabapentin (NEURONTIN) 300 MG capsule One tab PO qHS for a week, then BID for a week, then TID. May double weekly to a max of 3,600mg /day 01/06/19   Rodolph Bong, MD  JARDIANCE 25 MG TABS tablet  07/11/18   [provider]  Lancets (FREESTYLE) lancets SMARTSIG:1 Each Topical 4 Times Daily 07/11/21   [provider]  LANTUS SOLOSTAR 100 UNIT/ML Solostar Pen 30 Units every morning. 06/07/18   [provider]  levothyroxine (SYNTHROID) 137 MCG tablet Take 137 mcg by mouth daily before breakfast.    [provider]  methocarbamol (ROBAXIN-750) 750 MG tablet Take 1 tablet (750 mg total) by mouth 4 (four) times daily. 03/11/23 04/10/23  Louann Sjogren, DPM  MOUNJARO 12.5 MG/0.5ML Pen SMARTSIG:12.5 Milligram(s) SUB-Q Once a Week 12/25/21   [provider]  NITROSTAT 0.4 MG SL tablet Place 0.4 mg under the tongue every 5 (five) minutes x 3 doses as needed. 12/04/14   [provider]  Omega-3 Fatty Acids (FISH OIL) 1000 MG CAPS Take 1,000 mg by mouth 2 (two) times daily.     [provider]  omeprazole (PRILOSEC) 40 MG capsule Take 40 mg by mouth daily.    [provider]  temazepam (RESTORIL) 30 MG capsule Take 30 mg by mouth at bedtime as needed for sleep.    [provider]  tirzepatide Greggory Keen) 12.5 MG/0.5ML Pen Inject 12.5 mg into the skin once a week. 11/25/22     umeclidinium-vilanterol (ANORO ELLIPTA) 62.5-25 MCG/ACT AEPB Inhale 1 puff into the lungs daily. 04/07/22   Tomma Lightning, MD  Vitamin D, Ergocalciferol, (DRISDOL) 1.25 MG (50000 UNIT) CAPS capsule TAKE 1 CAPSULE  ONCE   AS DIRECTED    [provider]    Family History Family History  Problem Relation Age of Onset   Heart failure Mother    Non-Hodgkin's lymphoma Father    Diabetes Sister    Heart disease Sister    Heart disease Sister    Diabetes Sister    Heart disease Sister    Diabetes Sister     Social History Social History   Tobacco Use   Smoking status: Former    Packs/day: 1.00    Years: 20.00    Additional pack years: 0.00    Total pack years: 20.00    Types: Cigarettes    Quit date: 06/26/2010    Years since quitting: 12.7   Smokeless  tobacco: Never  Vaping Use   Vaping Use: Never used  Substance Use Topics   Alcohol use: No    Alcohol/week: 0.0 standard drinks of alcohol   Drug use: No     Allergies   Hydromorphone, Hydromorphone hcl, Rosuvastatin, Codeine, Hydromorphone hcl, and Trazodone and nefazodone   Review of Systems Review of Systems  Eyes:  Positive for redness and itching.     Physical Exam Triage Vital Signs ED Triage Vitals [03/13/23 1325]  Enc Vitals Group     BP 108/66     Pulse Rate 76     Resp 18     Temp (!) 97.5 F (36.4 C)     Temp Source Oral     SpO2 98 %     Weight      Height      Head Circumference      Peak Flow      Pain Score 0     Pain Loc      Pain Edu?      Excl. in GC?    No data found.  Updated Vital Signs BP 108/66 (BP Location: Right Arm)   Pulse 76   Temp (!) 97.5 F (36.4 C) (Oral)   Resp 18   SpO2 98%   Physical Exam Vitals and nursing note reviewed.  Constitutional:      Appearance: Normal appearance. She is normal weight.  HENT:     Head: Normocephalic and atraumatic.     Mouth/Throat:     Mouth: Mucous membranes are moist.     Pharynx: Oropharynx is clear.  Eyes:     Extraocular Movements: Extraocular movements intact.     Conjunctiva/sclera: Conjunctivae normal.     Pupils: Pupils are equal, round, and reactive to light.     Comments: Left eye: Sclera with +3 injection  Cardiovascular:     Pulses: Normal pulses.      Heart sounds: Normal heart sounds.  Pulmonary:     Effort: Pulmonary effort is normal.     Breath sounds: Normal breath sounds. No wheezing, rhonchi or rales.  Musculoskeletal:        General: Normal range of motion.     Cervical back: Normal range of motion and neck supple.  Skin:    General: Skin is warm and dry.  Neurological:     General: No focal deficit present.     Mental Status: She is alert and oriented to person, place, and time. Mental status is at baseline.  Psychiatric:        Mood and Affect: Mood normal.        Behavior: Behavior normal.        Thought Content: Thought content normal.      UC Treatments / Results  Labs (all labs ordered are listed, but only abnormal results are displayed) Labs Reviewed - No data to display  EKG   Radiology No results found.  Procedures Procedures (including critical care time)  Medications Ordered in UC Medications - No data to display  Initial Impression / Assessment and Plan / UC Course  I have reviewed the triage vital signs and the nursing notes.  Pertinent labs & imaging results that were available during my care of the patient were reviewed by me and considered in my medical decision making (see chart for details).     MDM: 1.  Conjunctivitis of left eye, unspecified conjunctivitis type-Rx'd Vigamox 0.5 % ophthalmic solution: Place 1 drop into left eye 3  times daily for the next 7 days. Instructed patient to instill eyedrops as directed.  Encouraged increase daily water intake while taking these eyedrops.  Advised patient if right eye develops similar symptoms may use Vigamox and right eye as well.  Advised if symptoms worsen and/or unresolved please follow-up with optometry/ophthalmology for further evaluation.  Patient discharged home, hemodynamically stable.   Final Clinical Impressions(s) / UC Diagnoses   Final diagnoses:  Conjunctivitis of left eye, unspecified conjunctivitis type     Discharge  Instructions      Instructed patient to instill eyedrops as directed.  Encouraged increase daily water intake while taking these eyedrops.  Advised patient if right eye develops similar symptoms may use Vigamox and right eye as well.  Advised if symptoms worsen and/or unresolved please follow-up with optometry/ophthalmology for further evaluation.     ED Prescriptions     Medication Sig Dispense Auth. Provider   moxifloxacin (VIGAMOX) 0.5 % ophthalmic solution Place 1 drop into the left eye 3 (three) times daily. 3 mL Trevor Iha, FNP      PDMP not reviewed this encounter.   Trevor Iha, FNP 03/13/23 1430

## 2023-04-10 ENCOUNTER — Ambulatory Visit: Payer: Medicare Other | Admitting: Podiatry

## 2023-04-23 ENCOUNTER — Other Ambulatory Visit (HOSPITAL_COMMUNITY): Payer: Self-pay

## 2023-04-23 ENCOUNTER — Other Ambulatory Visit (HOSPITAL_BASED_OUTPATIENT_CLINIC_OR_DEPARTMENT_OTHER): Payer: Self-pay

## 2023-04-23 ENCOUNTER — Encounter (HOSPITAL_BASED_OUTPATIENT_CLINIC_OR_DEPARTMENT_OTHER): Payer: Self-pay

## 2023-04-23 MED ORDER — MOUNJARO 12.5 MG/0.5ML ~~LOC~~ SOAJ
12.5000 mg | SUBCUTANEOUS | 3 refills | Status: AC
  Filled 2023-04-23 – 2023-04-27 (×3): qty 2, 28d supply, fill #0

## 2023-04-27 ENCOUNTER — Other Ambulatory Visit (HOSPITAL_BASED_OUTPATIENT_CLINIC_OR_DEPARTMENT_OTHER): Payer: Self-pay

## 2023-04-27 ENCOUNTER — Other Ambulatory Visit (HOSPITAL_COMMUNITY): Payer: Self-pay

## 2023-06-11 ENCOUNTER — Encounter: Payer: Self-pay | Admitting: Podiatry

## 2023-06-11 ENCOUNTER — Ambulatory Visit (INDEPENDENT_AMBULATORY_CARE_PROVIDER_SITE_OTHER): Payer: Medicare Other | Admitting: Podiatry

## 2023-06-11 DIAGNOSIS — Z91199 Patient's noncompliance with other medical treatment and regimen due to unspecified reason: Secondary | ICD-10-CM

## 2023-06-11 NOTE — Progress Notes (Signed)
No show

## 2023-06-24 ENCOUNTER — Other Ambulatory Visit: Payer: Self-pay | Admitting: Pulmonary Disease

## 2023-08-10 ENCOUNTER — Ambulatory Visit: Payer: Self-pay | Admitting: Cardiology

## 2023-08-19 IMAGING — DX DG FOOT COMPLETE 3+V*R*
3 series · 3 of 3 positions shown · non-contrast
Comparison: None.

CLINICAL DATA: Right great toe pain times several months.

EXAM:
RIGHT FOOT COMPLETE - 3+ VIEW

[foot ap wb]
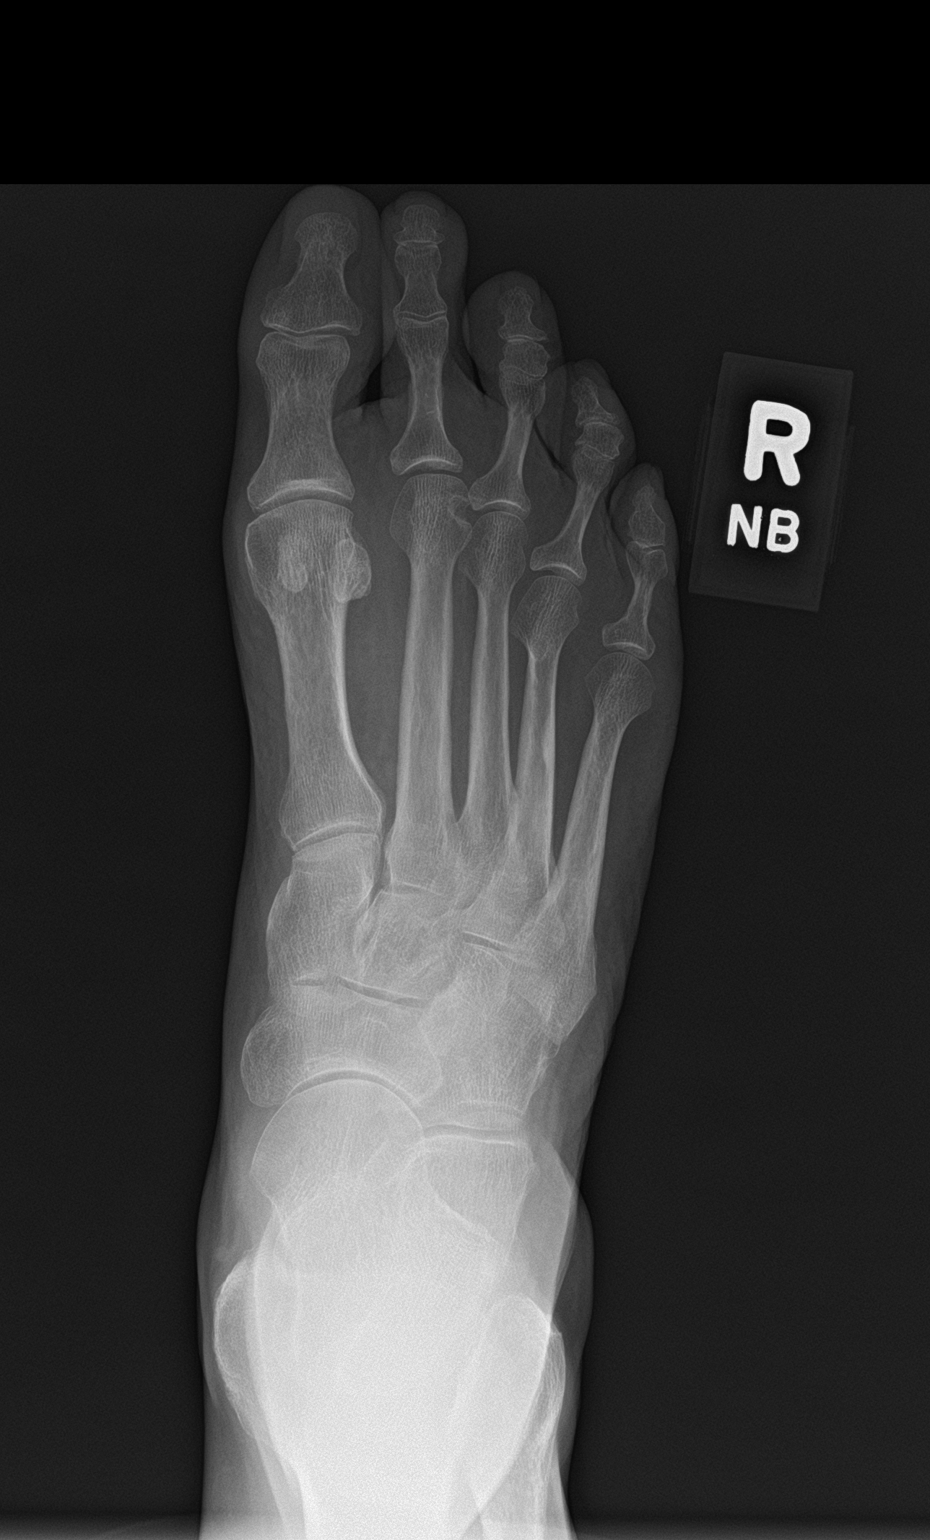

[foot obl wb]
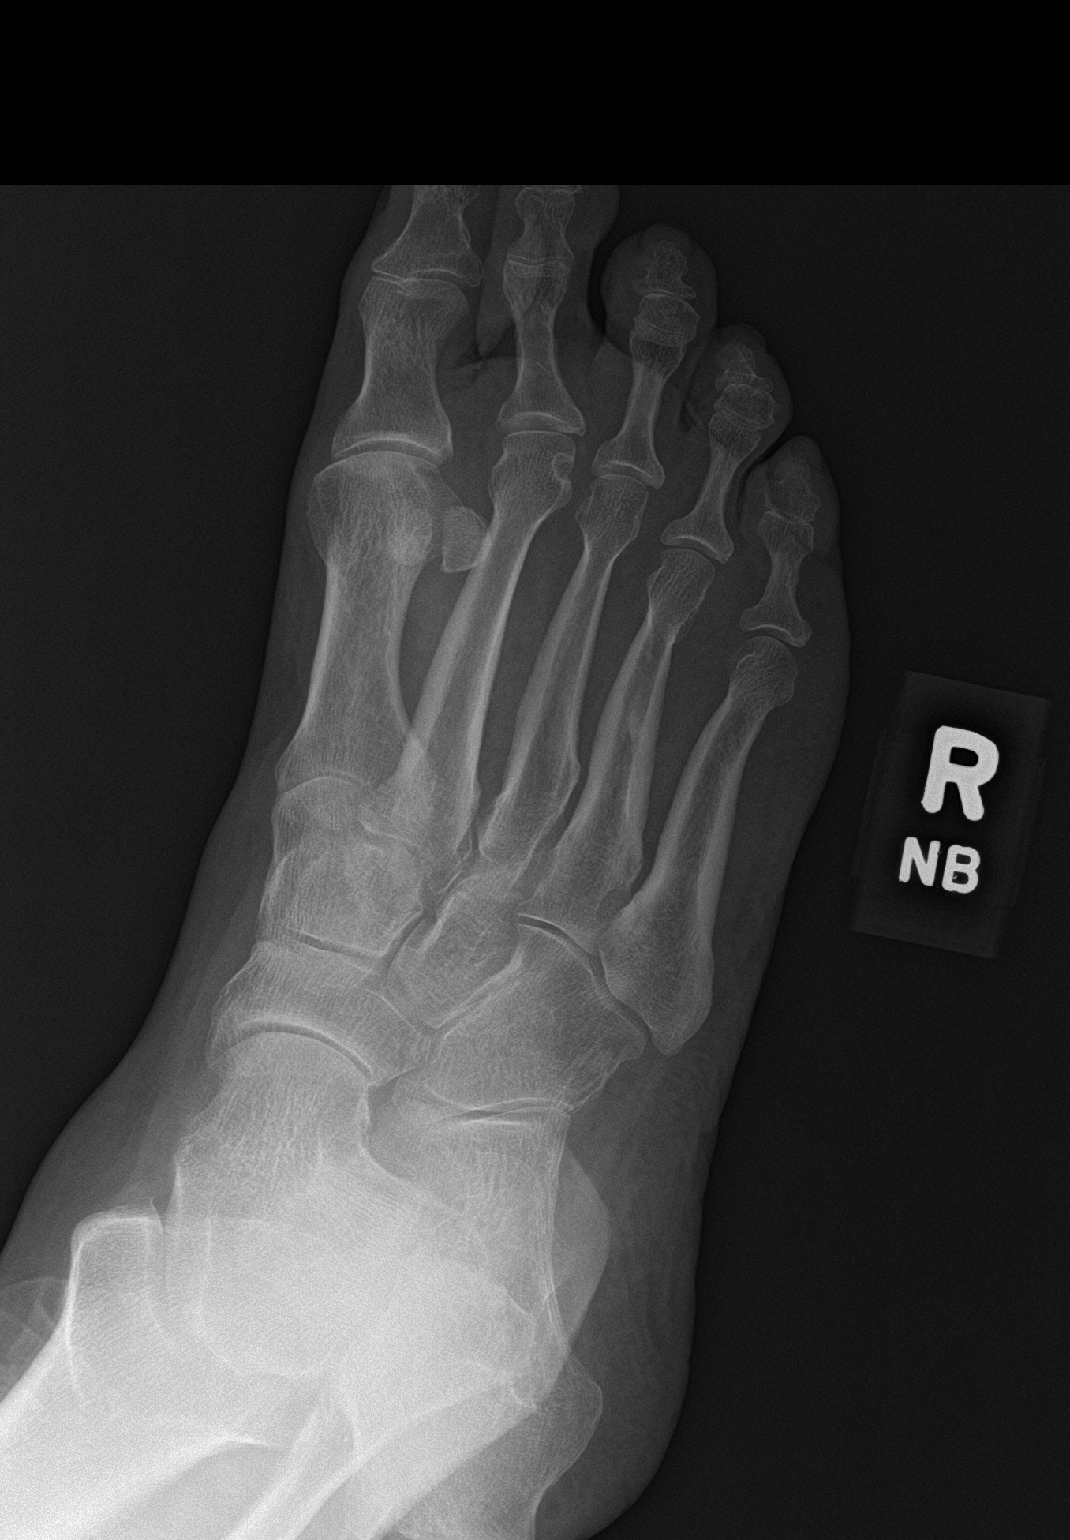

[foot lat wb]
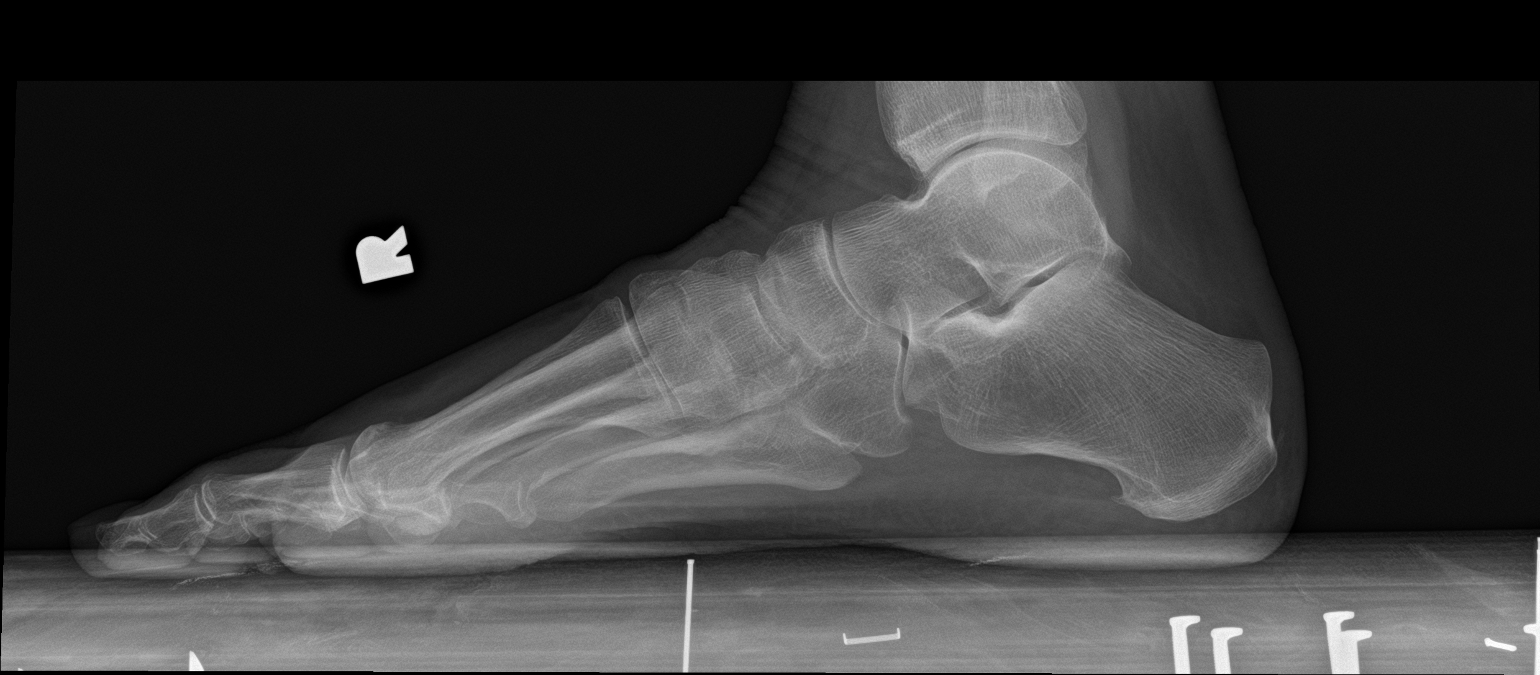

[3 of 3 positions shown; findings below may reference images not displayed]

FINDINGS: There is no evidence of acute fracture or dislocation. A chronic
versus congenital deformity is seen involving the distal aspect of
the second right metatarsal. There is no evidence of arthropathy or
other focal bone abnormality. Soft tissues are unremarkable.
IMPRESSION: No acute osseous abnormality.

## 2023-09-02 ENCOUNTER — Ambulatory Visit: Payer: Medicare Other | Attending: Cardiology | Admitting: Cardiology

## 2023-09-02 ENCOUNTER — Encounter: Payer: Self-pay | Admitting: Cardiology

## 2023-09-02 VITALS — BP 98/58 | HR 81 | Resp 16 | Ht 64.0 in | Wt 124.4 lb

## 2023-09-02 DIAGNOSIS — E782 Mixed hyperlipidemia: Secondary | ICD-10-CM | POA: Insufficient documentation

## 2023-09-02 DIAGNOSIS — I2510 Atherosclerotic heart disease of native coronary artery without angina pectoris: Secondary | ICD-10-CM | POA: Diagnosis present

## 2023-09-02 NOTE — Patient Instructions (Signed)
Medication Instructions:   Your physician recommends that you continue on your current medications as directed. Please refer to the Current Medication list given to you today.  *If you need a refill on your cardiac medications before your next appointment, please call your pharmacy*    Follow-Up: At [facility], you and your health needs are our priority.  As part of our continuing mission to provide you with exceptional heart care, we have created designated Provider Care Teams.  These Care Teams include your primary Cardiologist (physician) and Advanced Practice Providers (APPs -  Physician Assistants and Nurse Practitioners) who all work together to provide you with the care you need, when you need it.  We recommend signing up for the patient portal called "MyChart".  Sign up information is provided on this After Visit Summary.  MyChart is used to connect with patients for Virtual Visits (Telemedicine).  Patients are able to view lab/test results, encounter notes, upcoming appointments, etc.  Non-urgent messages can be sent to your provider as well.   To learn more about what you can do with MyChart, go to [email or web address].    Your next appointment:   1 year(s)  Provider:   Dr. Rosemary Holms

## 2023-09-02 NOTE — Progress Notes (Signed)
  Cardiology Office Note:  .   Date:  09/02/2023  ID:  Kelli Rojas, DOB 1955-12-23, MRN [id number] PCP: Adrian Prince, MD  [facility] Providers Cardiologist:  Truett Mainland, MD PCP: Adrian Prince, MD  Chief Complaint  Patient presents with   Coronary artery disease involving native coronary artery of   Follow-up      History of Present Illness: .    Kelli Rojas is a 67 y.o. female with hyperlipidemia, type II diabetes mellitus, mild nonobstructive coronary artery disease, former smoker, h/o lung cancer status post right lobectomy   Patient is doing well, denies any recent chest pain or shortness of breath.  Reviewed recent stress test results, details below.  Vitals:   09/02/23 1305  BP: (!) 98/58  Pulse: 81  Resp: 16  SpO2: 97%     ROS:  Review of Systems  Cardiovascular:  Negative for chest pain, dyspnea on exertion, leg swelling, palpitations and syncope.     Studies Reviewed: Marland Kitchen        Exercise nuclear stress test 02/12/2023: Breast attenuation noted in the rest and stress images in the inferior and infero-septal region. No ischemia or scar noted.  Overall LV systolic function is normal without regional wall motion abnormalities. Stress LV EF: 58%.  Normal ECG stress. The patient exercised for 6 minutes and 40 seconds of a Bruce protocol, achieving approximately 8.05 METs & 87% MPHR. Stress terminated due to THR achieved.  The blood pressure response was normal. No previous exam available for comparison. Low risk study.   Independently interpreted Labs 06/2023: Chol 135, TG 57, HDL 56, LDL 67 HbA1C 5.5% Hb 12. Cr 0.6    Physical Exam:   Physical Exam Vitals and nursing note reviewed.  Constitutional:      General: She is not in acute distress. Neck:     Vascular: No JVD.  Cardiovascular:     Rate and Rhythm: Normal rate and regular rhythm.     Heart sounds: Normal heart sounds. No murmur heard. Pulmonary:     Effort: Pulmonary  effort is normal.     Breath sounds: Normal breath sounds. No wheezing or rales.      VISIT DIAGNOSES:   ICD-10-CM   1. Coronary artery disease involving native coronary artery of native heart without angina pectoris  I25.10     2. Mixed hyperlipidemia  E78.2        ASSESSMENT AND PLAN: .    Kelli Rojas is a 67 y.o. female with hyperlipidemia, type II diabetes mellitus, mild nonobstructive coronary artery disease, former smoker, h/o lung cancer status post right lobectomy   Chest tightness: Resolved. No significant ischemia on stress testing. Continue aspirin, statin.  Hyperlipidemia: Controlled   Type 2 DM: Continue follow-up with Dr. Evlyn Kanner.      F/u in 1 year  Signed, [name], MD

## 2024-01-25 ENCOUNTER — Other Ambulatory Visit: Payer: Self-pay | Admitting: Cardiology

## 2024-05-05 ENCOUNTER — Other Ambulatory Visit: Payer: Self-pay | Admitting: Pulmonary Disease

## 2024-07-25 ENCOUNTER — Other Ambulatory Visit: Payer: Self-pay | Admitting: Cardiology

## 2024-09-14 ENCOUNTER — Ambulatory Visit (INDEPENDENT_AMBULATORY_CARE_PROVIDER_SITE_OTHER)

## 2024-09-14 ENCOUNTER — Ambulatory Visit: Admitting: Podiatry

## 2024-09-14 ENCOUNTER — Encounter: Payer: Self-pay | Admitting: Podiatry

## 2024-09-14 DIAGNOSIS — M722 Plantar fascial fibromatosis: Secondary | ICD-10-CM | POA: Diagnosis not present

## 2024-09-14 DIAGNOSIS — Q6671 Congenital pes cavus, right foot: Secondary | ICD-10-CM

## 2024-09-14 DIAGNOSIS — Q6672 Congenital pes cavus, left foot: Secondary | ICD-10-CM | POA: Diagnosis not present

## 2024-09-14 NOTE — Progress Notes (Signed)
°  Subjective:  Patient ID: Kelli Rojas, female    DOB: 12-09-1955,   MRN: [id number]  Chief Complaint  Patient presents with   Foot Pain    I have pain in my arch.  It causes my hip to hurt.     68 y.o. female presents today for new concern of pain in her left foot arch.  She relates has been going on for a couple months.  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  She relates her ball of the foot is doing better on the left but still having some trouble on the right foot.  Last A1c was  8 on 08/29/21. [name] Denies any other pedal complaints. Denies n/v/f/c.   PCP Garnette Ore MD.   Past Medical History:  Diagnosis Date   Anxiety    Cancer (HCC)    lung   Depression    Diabetes mellitus without complication (HCC)    Diabetic peripheral neuropathy (HCC)    High cholesterol    Parkinson's disease (HCC)    Thyroid disease     Objective:  Physical Exam: Vascular: DP/PT pulses 2/4 bilateral. CFT <3 seconds. Normal hair growth on digits. No edema.  Skin. No lacerations or abrasions bilateral feet. Mild incurvation noted to bilateral borders of right hallux.  Musculoskeletal: MMT 5/5 bilateral lower extremities in DF, PF, Inversion and Eversion. Deceased ROM in DF of ankle joint.  Tender today in the left foot arch area no pain to medial calcaneal tubercle no pain elsewhere about the foot today. Neurological: Decreased sensation dorsally to second third and fifth right digits. Protective sensation intact.  Assessment:   1. Congenital bilateral pes cavus         Plan:   Patient was evaluated and treated and all questions answered. -Discussed and educated patient on diabetic foot care, especially with  regards to the vascular, neurological and musculoskeletal systems.  -Stressed the importance of good glycemic control and the detriment of not  controlling glucose levels in relation to the foot. -Discussed supportive shoes at  all times and checking feet regularly.  -X-rays reviewed and discussed with patient.  No acute fractures or dislocations noted.  Pes cavus noted on the foot with increased calcaneal inclination angle noted. Discussed pes cavus and treatment options with patient.  Discussed padding and offloading today. Discussed custom orthotics.  Will get fitted today. Patient to return as needed.     Asberry Failing, DPM

## 2024-10-03 ENCOUNTER — Ambulatory Visit

## 2024-10-03 ENCOUNTER — Encounter: Payer: Self-pay | Admitting: Podiatry

## 2024-10-03 ENCOUNTER — Ambulatory Visit (INDEPENDENT_AMBULATORY_CARE_PROVIDER_SITE_OTHER): Admitting: Podiatry

## 2024-10-03 DIAGNOSIS — G5761 Lesion of plantar nerve, right lower limb: Secondary | ICD-10-CM | POA: Diagnosis not present

## 2024-10-03 DIAGNOSIS — M79671 Pain in right foot: Secondary | ICD-10-CM | POA: Diagnosis not present

## 2024-10-03 DIAGNOSIS — M2041 Other hammer toe(s) (acquired), right foot: Secondary | ICD-10-CM

## 2024-10-03 MED ORDER — DEXAMETHASONE SODIUM PHOSPHATE 120 MG/30ML IJ SOLN
4.0000 mg | Freq: Once | INTRAMUSCULAR | Status: AC
  Administered 2024-10-03: 4 mg via INTRA_ARTICULAR

## 2024-10-03 MED ORDER — TRIAMCINOLONE ACETONIDE 10 MG/ML IJ SUSP
2.5000 mg | Freq: Once | INTRAMUSCULAR | Status: AC
  Administered 2024-10-03: 2.5 mg via INTRA_ARTICULAR

## 2024-10-03 NOTE — Progress Notes (Signed)
"  °  Subjective:  Patient ID: Kelli Rojas, female    DOB: 1956/09/06,   MRN: [id number]  Chief Complaint  Patient presents with   Foot Pain    My three toes on the end, it hurts down the top of my left foot.  It hurts under the toes. (dorsal pain 3-5 met; 3-5 mpj)     69 y.o. female presents today concerned again of numbness and irritation in her left foot.  She relates mostly in her 3rd, 4th and 5th toes today.  She is still waiting her orthotics.  She relates mostly numbness but some irritation as well.   Last A1c was  8 on 08/29/21. Kelli Rojas Denies any other pedal complaints. Denies n/v/f/c.   PCP Garnette Ore MD.   Past Medical History:  Diagnosis Date   Anxiety    Cancer (HCC)    lung   Depression    Diabetes mellitus without complication (HCC)    Diabetic peripheral neuropathy (HCC)    High cholesterol    Parkinson's disease (HCC)    Thyroid disease     Objective:  Physical Exam: Vascular: DP/PT pulses 2/4 bilateral. CFT <3 seconds. Normal hair growth on digits. No edema.  Skin. No lacerations or abrasions bilateral feet. Mild incurvation noted to bilateral borders of right hallux.  Musculoskeletal: MMT 5/5 bilateral lower extremities in DF, PF, Inversion and Eversion. Deceased ROM in DF of ankle joint. Pain  in third interspace and pain to palpation. Pain with metatarsal squeeze. On the left today there is pain in the second interspace to palpation and pain with metatarsal squeeze.  Neurological: Decreased sensation dorsally to second third and fifth right digits. Protective sensation intact.  Assessment:   1. Morton's metatarsalgia, neuralgia, or neuroma, right          Plan:   Patient was evaluated and treated and all questions answered. -Discussed and educated patient on diabetic foot care, especially with  regards to the vascular, neurological and musculoskeletal systems.  -Stressed the importance of good glycemic control and the detriment of not  controlling  glucose levels in relation to the foot. -Discussed supportive shoes at all times and checking feet regularly.  Discussed metatarsalgia and mortons neuroma and treatment options with patient. Likely this is presenting on the left side as well now.  Discussed padding and offloading today. Discussed custom orthotics. Patient would like to hold off.   Injection recommended today. Procedure note below.  Patient to return as needed.      Procedure: Injection Tendon/Ligament Discussed alternatives, risks, complications and verbal consent was obtained.  Location: Right third interspace.  Skin Prep: Alcohol. Injectate: 1cc 0.5% marcaine plain, 1 cc dexamethasone  x2 Disposition: Patient tolerated procedure well. Injection site dressed with a band-aid.  Post-injection care was discussed and return precautions discussed.     Asberry Failing, DPM   "

## 2024-10-03 NOTE — Telephone Encounter (Signed)
 I called and asked the patient to arrive 20 minutes before her appointment and stop by imaging for xrays prior to coming to us .  I asked her to enter the building via the  back entrance.

## 2024-10-13 ENCOUNTER — Telehealth: Payer: Self-pay | Admitting: Podiatry

## 2024-10-21 ENCOUNTER — Other Ambulatory Visit: Payer: Self-pay | Admitting: Cardiology

## 2024-10-24 ENCOUNTER — Ambulatory Visit: Admitting: Podiatry

## 2024-10-28 ENCOUNTER — Ambulatory Visit: Admitting: Podiatry

## 2024-10-28 ENCOUNTER — Encounter: Payer: Self-pay | Admitting: Podiatry

## 2024-10-28 DIAGNOSIS — G5761 Lesion of plantar nerve, right lower limb: Secondary | ICD-10-CM

## 2024-10-28 NOTE — Patient Instructions (Addendum)

## 2024-10-28 NOTE — Progress Notes (Signed)
Patient presents today for orthotic pick up. Patient voices no new complaints.   Orthotics were fitted to patient's feet. No discomfort and no rubbing. Patient satisfied with the orthotics.   Orthotics were dispensed to patient with instructions for break in wear and to call the office if any concerns or questions.
